# Patient Record
Sex: Female | Born: 1980 | Race: Black or African American | Hispanic: No | Marital: Single | Smoking: Never smoker
Health system: Southern US, Community
[De-identification: ages and names within clinical notes are randomized; demographics above are authoritative.]

## PROBLEM LIST (undated history)

## (undated) DIAGNOSIS — E119 Type 2 diabetes mellitus without complications: Secondary | ICD-10-CM

## (undated) DIAGNOSIS — G5600 Carpal tunnel syndrome, unspecified upper limb: Secondary | ICD-10-CM

## (undated) DIAGNOSIS — R200 Anesthesia of skin: Secondary | ICD-10-CM

## (undated) DIAGNOSIS — E785 Hyperlipidemia, unspecified: Secondary | ICD-10-CM

## (undated) DIAGNOSIS — F32A Depression, unspecified: Secondary | ICD-10-CM

## (undated) DIAGNOSIS — L732 Hidradenitis suppurativa: Secondary | ICD-10-CM

## (undated) DIAGNOSIS — IMO0001 Reserved for inherently not codable concepts without codable children: Secondary | ICD-10-CM

## (undated) DIAGNOSIS — O24419 Gestational diabetes mellitus in pregnancy, unspecified control: Secondary | ICD-10-CM

## (undated) DIAGNOSIS — H9190 Unspecified hearing loss, unspecified ear: Secondary | ICD-10-CM

## (undated) DIAGNOSIS — E039 Hypothyroidism, unspecified: Secondary | ICD-10-CM

## (undated) DIAGNOSIS — E669 Obesity, unspecified: Secondary | ICD-10-CM

## (undated) HISTORY — PX: TONSILLECTOMY: SUR1361

## (undated) HISTORY — DX: Anesthesia of skin: R20.0

## (undated) HISTORY — PX: THYROID SURGERY: SHX805

---
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
## 2002-11-26 ENCOUNTER — Emergency Department (HOSPITAL_COMMUNITY): Admission: EM | Admit: 2002-11-26 | Discharge: 2002-11-26 | Payer: Self-pay | Admitting: Emergency Medicine

## 2002-12-05 ENCOUNTER — Emergency Department (HOSPITAL_COMMUNITY): Admission: EM | Admit: 2002-12-05 | Discharge: 2002-12-05 | Payer: Self-pay | Admitting: Emergency Medicine

## 2003-04-07 ENCOUNTER — Inpatient Hospital Stay (HOSPITAL_COMMUNITY): Admission: AD | Admit: 2003-04-07 | Discharge: 2003-04-07 | Payer: Self-pay | Admitting: Obstetrics and Gynecology

## 2003-05-17 ENCOUNTER — Ambulatory Visit (HOSPITAL_COMMUNITY): Admission: RE | Admit: 2003-05-17 | Discharge: 2003-05-17 | Payer: Self-pay | Admitting: *Deleted

## 2003-06-07 ENCOUNTER — Ambulatory Visit (HOSPITAL_COMMUNITY): Admission: RE | Admit: 2003-06-07 | Discharge: 2003-06-07 | Payer: Self-pay | Admitting: *Deleted

## 2003-08-02 ENCOUNTER — Inpatient Hospital Stay (HOSPITAL_COMMUNITY): Admission: AD | Admit: 2003-08-02 | Discharge: 2003-08-02 | Payer: Self-pay | Admitting: Obstetrics

## 2003-08-13 ENCOUNTER — Inpatient Hospital Stay (HOSPITAL_COMMUNITY): Admission: AD | Admit: 2003-08-13 | Discharge: 2003-08-13 | Payer: Self-pay | Admitting: Obstetrics

## 2003-10-25 ENCOUNTER — Inpatient Hospital Stay (HOSPITAL_COMMUNITY): Admission: AD | Admit: 2003-10-25 | Discharge: 2003-10-30 | Payer: Self-pay | Admitting: Obstetrics

## 2003-10-25 ENCOUNTER — Ambulatory Visit (HOSPITAL_COMMUNITY): Admission: RE | Admit: 2003-10-25 | Discharge: 2003-10-25 | Payer: Self-pay | Admitting: Obstetrics

## 2004-02-15 ENCOUNTER — Emergency Department (HOSPITAL_COMMUNITY): Admission: EM | Admit: 2004-02-15 | Discharge: 2004-02-15 | Payer: Self-pay | Admitting: Emergency Medicine

## 2004-06-09 ENCOUNTER — Emergency Department (HOSPITAL_COMMUNITY): Admission: EM | Admit: 2004-06-09 | Discharge: 2004-06-09 | Payer: Self-pay | Admitting: Family Medicine

## 2004-10-13 ENCOUNTER — Inpatient Hospital Stay (HOSPITAL_COMMUNITY): Admission: AD | Admit: 2004-10-13 | Discharge: 2004-10-13 | Payer: Self-pay | Admitting: Obstetrics

## 2004-11-17 ENCOUNTER — Inpatient Hospital Stay (HOSPITAL_COMMUNITY): Admission: AD | Admit: 2004-11-17 | Discharge: 2004-11-18 | Payer: Self-pay | Admitting: Obstetrics

## 2004-12-28 ENCOUNTER — Inpatient Hospital Stay (HOSPITAL_COMMUNITY): Admission: AD | Admit: 2004-12-28 | Discharge: 2004-12-29 | Payer: Self-pay | Admitting: Obstetrics

## 2005-01-07 ENCOUNTER — Inpatient Hospital Stay (HOSPITAL_COMMUNITY): Admission: AD | Admit: 2005-01-07 | Discharge: 2005-01-07 | Payer: Self-pay | Admitting: Obstetrics

## 2005-01-31 ENCOUNTER — Inpatient Hospital Stay (HOSPITAL_COMMUNITY): Admission: AD | Admit: 2005-01-31 | Discharge: 2005-01-31 | Payer: Self-pay | Admitting: Obstetrics

## 2005-03-04 ENCOUNTER — Inpatient Hospital Stay (HOSPITAL_COMMUNITY): Admission: AD | Admit: 2005-03-04 | Discharge: 2005-03-04 | Payer: Self-pay | Admitting: Obstetrics

## 2005-03-11 ENCOUNTER — Inpatient Hospital Stay (HOSPITAL_COMMUNITY): Admission: AD | Admit: 2005-03-11 | Discharge: 2005-03-14 | Payer: Self-pay | Admitting: Obstetrics

## 2005-05-20 ENCOUNTER — Emergency Department (HOSPITAL_COMMUNITY): Admission: EM | Admit: 2005-05-20 | Discharge: 2005-05-20 | Payer: Self-pay | Admitting: Emergency Medicine

## 2005-09-11 ENCOUNTER — Emergency Department (HOSPITAL_COMMUNITY): Admission: EM | Admit: 2005-09-11 | Discharge: 2005-09-12 | Payer: Self-pay | Admitting: Emergency Medicine

## 2006-03-13 ENCOUNTER — Emergency Department (HOSPITAL_COMMUNITY): Admission: EM | Admit: 2006-03-13 | Discharge: 2006-03-13 | Payer: Self-pay | Admitting: Emergency Medicine

## 2007-01-09 ENCOUNTER — Ambulatory Visit (HOSPITAL_BASED_OUTPATIENT_CLINIC_OR_DEPARTMENT_OTHER): Admission: RE | Admit: 2007-01-09 | Discharge: 2007-01-09 | Payer: Self-pay | Admitting: Otolaryngology

## 2007-01-09 ENCOUNTER — Encounter (INDEPENDENT_AMBULATORY_CARE_PROVIDER_SITE_OTHER): Payer: Self-pay | Admitting: *Deleted

## 2007-04-05 ENCOUNTER — Emergency Department (HOSPITAL_COMMUNITY): Admission: EM | Admit: 2007-04-05 | Discharge: 2007-04-05 | Payer: Self-pay | Admitting: Family Medicine

## 2007-09-04 ENCOUNTER — Emergency Department (HOSPITAL_COMMUNITY): Admission: EM | Admit: 2007-09-04 | Discharge: 2007-09-04 | Payer: Self-pay | Admitting: Emergency Medicine

## 2008-05-03 ENCOUNTER — Emergency Department (HOSPITAL_COMMUNITY): Admission: EM | Admit: 2008-05-03 | Discharge: 2008-05-03 | Payer: Self-pay | Admitting: Emergency Medicine

## 2008-05-20 ENCOUNTER — Emergency Department (HOSPITAL_COMMUNITY): Admission: EM | Admit: 2008-05-20 | Discharge: 2008-05-20 | Payer: Self-pay | Admitting: Family Medicine

## 2008-06-05 ENCOUNTER — Emergency Department (HOSPITAL_COMMUNITY): Admission: EM | Admit: 2008-06-05 | Discharge: 2008-06-05 | Payer: Self-pay | Admitting: Emergency Medicine

## 2008-06-06 ENCOUNTER — Emergency Department (HOSPITAL_COMMUNITY): Admission: EM | Admit: 2008-06-06 | Discharge: 2008-06-06 | Payer: Self-pay | Admitting: Emergency Medicine

## 2008-07-29 ENCOUNTER — Emergency Department (HOSPITAL_COMMUNITY): Admission: EM | Admit: 2008-07-29 | Discharge: 2008-07-29 | Payer: Self-pay | Admitting: Family Medicine

## 2008-12-13 ENCOUNTER — Emergency Department (HOSPITAL_COMMUNITY): Admission: EM | Admit: 2008-12-13 | Discharge: 2008-12-13 | Payer: Self-pay | Admitting: Emergency Medicine

## 2009-02-23 ENCOUNTER — Other Ambulatory Visit: Payer: Self-pay | Admitting: Emergency Medicine

## 2009-02-24 ENCOUNTER — Ambulatory Visit: Payer: Self-pay | Admitting: Psychiatry

## 2009-02-24 ENCOUNTER — Inpatient Hospital Stay (HOSPITAL_COMMUNITY): Admission: RE | Admit: 2009-02-24 | Discharge: 2009-02-26 | Payer: Self-pay | Admitting: Psychiatry

## 2009-05-15 ENCOUNTER — Emergency Department (HOSPITAL_COMMUNITY): Admission: EM | Admit: 2009-05-15 | Discharge: 2009-05-16 | Payer: Self-pay | Admitting: Emergency Medicine

## 2009-06-04 ENCOUNTER — Emergency Department (HOSPITAL_COMMUNITY): Admission: EM | Admit: 2009-06-04 | Discharge: 2009-06-04 | Payer: Self-pay | Admitting: Family Medicine

## 2009-07-04 ENCOUNTER — Encounter: Admission: RE | Admit: 2009-07-04 | Discharge: 2009-09-08 | Payer: Self-pay | Admitting: Sports Medicine

## 2009-08-18 ENCOUNTER — Emergency Department (HOSPITAL_COMMUNITY): Admission: EM | Admit: 2009-08-18 | Discharge: 2009-08-18 | Payer: Self-pay | Admitting: Family Medicine

## 2010-05-29 ENCOUNTER — Emergency Department (HOSPITAL_COMMUNITY): Admission: EM | Admit: 2010-05-29 | Discharge: 2010-05-29 | Payer: Self-pay | Admitting: Family Medicine

## 2010-11-17 ENCOUNTER — Emergency Department (HOSPITAL_COMMUNITY)
Admission: EM | Admit: 2010-11-17 | Discharge: 2010-11-18 | Disposition: A | Discharge status: Home / Self Care | Payer: Medicaid Other | Attending: Emergency Medicine | Admitting: Emergency Medicine

## 2010-11-17 DIAGNOSIS — F329 Major depressive disorder, single episode, unspecified: Secondary | ICD-10-CM | POA: Insufficient documentation

## 2010-11-17 DIAGNOSIS — R51 Headache: Secondary | ICD-10-CM | POA: Insufficient documentation

## 2010-11-17 DIAGNOSIS — F3289 Other specified depressive episodes: Secondary | ICD-10-CM | POA: Insufficient documentation

## 2010-11-17 DIAGNOSIS — R05 Cough: Secondary | ICD-10-CM | POA: Insufficient documentation

## 2010-11-17 DIAGNOSIS — R002 Palpitations: Secondary | ICD-10-CM | POA: Insufficient documentation

## 2010-11-17 DIAGNOSIS — R059 Cough, unspecified: Secondary | ICD-10-CM | POA: Insufficient documentation

## 2010-11-18 ENCOUNTER — Emergency Department (HOSPITAL_COMMUNITY): Payer: Medicaid Other

## 2010-11-18 LAB — CBC
HCT: 36.3 % (ref 36.0–46.0)
MCH: 25.8 pg — ABNORMAL LOW (ref 26.0–34.0)
MCHC: 32.5 g/dL (ref 30.0–36.0)
MCV: 79.4 fL (ref 78.0–100.0)
RBC: 4.57 MIL/uL (ref 3.87–5.11)
WBC: 9.6 10*3/uL (ref 4.0–10.5)

## 2010-11-18 LAB — POCT I-STAT, CHEM 8
BUN: 4 mg/dL — ABNORMAL LOW (ref 6–23)
Chloride: 101 mEq/L (ref 96–112)
TCO2: 27 mmol/L (ref 0–100)

## 2010-11-18 LAB — DIFFERENTIAL
Basophils Relative: 0 % (ref 0–1)
Eosinophils Absolute: 0.1 10*3/uL (ref 0.0–0.7)
Eosinophils Relative: 1 % (ref 0–5)
Lymphocytes Relative: 28 % (ref 12–46)
Lymphs Abs: 2.7 10*3/uL (ref 0.7–4.0)
Monocytes Absolute: 0.8 10*3/uL (ref 0.1–1.0)

## 2010-12-10 LAB — POCT URINALYSIS DIPSTICK
Bilirubin Urine: NEGATIVE
Ketones, ur: NEGATIVE mg/dL
Protein, ur: NEGATIVE mg/dL
pH: 6.5 (ref 5.0–8.0)

## 2010-12-10 LAB — POCT PREGNANCY, URINE: Preg Test, Ur: NEGATIVE

## 2010-12-30 LAB — POCT PREGNANCY, URINE: Preg Test, Ur: NEGATIVE

## 2011-01-05 LAB — SALICYLATE LEVEL: Salicylate Lvl: <4 mg/dL (ref 2.8–20.0)

## 2011-01-05 LAB — RAPID URINE DRUG SCREEN, HOSP PERFORMED
Benzodiazepines: NOT DETECTED
Cocaine: NOT DETECTED

## 2011-01-05 LAB — BASIC METABOLIC PANEL
CO2: 27 mEq/L (ref 19–32)
Creatinine, Ser: 0.54 mg/dL (ref 0.4–1.2)
Glucose, Bld: 101 mg/dL — ABNORMAL HIGH (ref 70–99)

## 2011-01-05 LAB — ACETAMINOPHEN LEVEL: Acetaminophen (Tylenol), Serum: <10 ug/mL — ABNORMAL LOW (ref 10–30)

## 2011-01-07 LAB — POCT RAPID STREP A (OFFICE): Streptococcus, Group A Screen (Direct): POSITIVE — AB

## 2011-02-09 NOTE — Consult Note (Signed)
NAMEFAYOLA, MECKES NO.:  [id number]   MEDICAL RECORD NO.:  [id number]          PATIENT TYPE:  EMS   LOCATION:  MAJO                         FACILITY:  [facility]   PHYSICIAN:  Antonietta Breach, M.D.  DATE OF BIRTH:  [birth year]   DATE OF CONSULTATION:  02/24/2009  DATE OF DISCHARGE:                                 CONSULTATION   REASON FOR CONSULTATION:  Overdose.   HISTORY OF PRESENT ILLNESS:  Ms. Mcgath is a 30 year old female  presenting to the [facility] on the Feb 23, 2009, with an overdose.  She overdosed on Motrin and Flexeril with the intention to kill herself.   She has been experiencing greater than 3 weeks of depressed mood,  anhedonia, had poor energy, difficulty concentrating.  She does have a  past history of major depression and treatment.   PAST PSYCHIATRIC HISTORY:  Ms. Billick has a history of multiple  overdoses in the past.   She does not have a history of increased energy or decreased need for  sleep.  She has no history of hallucinations.   She was treated for major depression in 2009 with Zoloft as well as  trazodone.  The trazodone worked for insomnia; however, her depression  did not respond to the treatment overall at that time.   FAMILY PSYCHIATRIC HISTORY:  None known.   SOCIAL HISTORY:  She uses alcohol occasionally.  She does not use any  illegal drugs.  She is single.   PAST MEDICAL HISTORY:  She does have hearing loss in both ears.   PAST SURGICAL HISTORY:  1. Tonsillectomy.  2. C-section.  3. She has had 6 teeth removed recently, which has been part of her      stress.   She has no known drug allergies.   She is not on any current medications.   LABORATORY DATA:  Tylenol negative.  Alcohol negative.  Sodium 142, BUN  6, creatinine 0.54, glucose 101.   Aspirin negative.   REVIEW OF SYSTEMS:  GENITOURINARY:  Pregnancy test negative.  ENDOCRINE/METABOLIC:  Her urine drug screen was negative.  CONSTITUTIONAL, HEAD,  EYES, EARS, NOSE, THROAT, MOUTH, NEUROLOGIC,  PSYCHIATRIC, CARDIOVASCULAR, RESPIRATORY, GASTROINTESTINAL, SKIN,  MUSCULOSKELETAL, HEMATOLOGIC, LYMPHATIC unremarkable.   PHYSICAL EXAMINATION:  VITAL SIGNS:  Temperature 97.8, pulse 99,  respiratory rate 16, blood pressure 128/77.  GENERAL APPEARANCE:  Ms. Herrada is a young female appearing her  chronologic age with no abnormal involuntary movements.   MENTAL STATUS EXAM:  She does maintain good eye contact.  She is alert.  Her affect is anxious.  Her mood is anxious.  Concentration is  decreased.  Her orientation is intact to all spheres.  Her memory  function is intact to immediate, recent, and remote.   Fund of knowledge and intelligence grossly within normal limits.  Her  speech involves normal rate and prosody.  without dysarthria.  Thought  process is logical, coherent, goal-directed, and no looseness of  association in thought content.  She does acknowledge suicidal intent.   Her insight is partial.  Her judgment is impaired.   ASSESSMENT:   AXIS  I:  1. 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  2. Major depressive disorder, recurrent, severe.  3. 293.84:  Anxiety disorder not otherwise specified.   AXIS II:  Deferred.   AXIS III:  See past medical history.   AXIS IV:  General medical, primary support group.   AXIS V:  Thirty.   Ms. Rhody is at risk for suicide.   The undersigned provided education, indications, alternatives, and  adverse effects of trazodone were discussed with the patient for anti-  insomnia.  She understands and would like to proceed with trazodone.   DISCUSSION:  The trazodone can potentially augment a primary anti-  depression medicine or can be utilized as the primary anti-depression  agent if increased to appropriate dosage.   RECOMMENDATIONS:  1. Would start trazodone at 50 mg  q.h.s. with 50 mg available p.r.n.      insomnia.  Would use caution regarding nausea or excessive      sedation.  2. Ego supportive psychotherapy.  3. Would admit to an inpatient psychiatric unit.     Antonietta Breach, M.D.  Electronically Signed    JW/MEDQ  D:  02/24/2009  T:  02/24/2009  Job:  [id number]

## 2011-02-12 NOTE — H&P (Signed)
Sherry Morris, Sherry Morris                          ACCOUNT NO.:  [id number]   MEDICAL RECORD NO.:  [id number]                   PATIENT TYPE:  INP   LOCATION:  9132                                 [facility]   PHYSICIAN:  Kathreen Cosier, M.D.           DATE OF BIRTH:  Jan 01, 1981   DATE OF ADMISSION:  10/25/2003  DATE OF DISCHARGE:                                HISTORY & PHYSICAL   HISTORY OF PRESENT ILLNESS:  The patient is a 30 year old, gravida 1, EDC  November 08, 2003, gestational diabetic, followed by [facility].  On  glyburide 2.5 mg p.o. daily blood sugars are normal.  Amniocentesis was  performed at 38 weeks on October 25, 2003, and she had LS 2.6:1 with PG.  The patient was brought in for induction.  Her cervix was 1 cm, 60%, vertex  -2 to -3, and overnight, October 25, 2003, she received Cytotec x2.  At 7:40  a.m., the cervix was 1 cm, 60%.  Membranes were ruptured artificially, the  fluid was clear, and she was started on low-dose Pitocin.  She had also been  followed with a stress test last weekend.  On admission, her CBG was 93.   The patient received Pitocin stimulation throughout the day, and at 2:35  p.m., she was 3-4 cm, 70%, vertex -2 to -3.  IUPC was inserted.  She was on  18 units of Pitocin.  She had adequate labor throughout the day, and by 9:28  p.m., her temperature was 101.1, her cervix was still unchanged, and there  was a lot of molding.  It was decided she would be delivered by cesarean  section for failure to progress.   PHYSICAL EXAMINATION:  GENERAL:  A well-developed female in no distress.  HEENT:  Negative.  LUNGS:  Clear.  HEART:  Regular rhythm.  No murmurs, no gallops.  ABDOMEN:  Term-sized uterus.  Estimated fetal weight 7 pounds.  PELVIC:  As described above.  EXTREMITIES:  Negative.                                               Kathreen Cosier, M.D.    BAM/MEDQ  D:  10/26/2003  T:  10/26/2003  Job:  [id number]

## 2011-02-12 NOTE — Discharge Summary (Signed)
Sherry Morris, Sherry Morris              ACCOUNT NO.:  [id number]   MEDICAL RECORD NO.:  [id number]          PATIENT TYPE:  INP   LOCATION:  9132                          [facility]   PHYSICIAN:  Kathreen Cosier, M.D.DATE OF BIRTH:  1980/10/26   DATE OF ADMISSION:  03/11/2005  DATE OF DISCHARGE:  03/14/2005                                 DISCHARGE SUMMARY   The patient is a 30 year old gravida 2, para 1, 0, 0, 1. She had a cesarean  section in the past. Her EDC was March 18, 2005. Positive GBS, RH negative,  has had RhoGAM. She was admitted in labor, cervix 1 cm, 70%, vertex and -3.   She was started on low dose Pitocin and got to 3 cm then she started having  deep variables with each contraction. The cervix was 4 cm and she was  delivered by C-section because of non reassuring fetal heart rate tracing.  She had a female with Apgar's 9 and 9, weighing 7 pounds, 11 ounces. A cord  was wrapped around the waist.   Postoperatively she did well. On admission her hemoglobin was 10.0,  postoperatively 8.5, platelets 237,000 and 186,000. White count 9.4 and 10.4  thousand. RPR negative.   The patient was discharged home on the third postoperative day, ambulatory,  on a regular diet. Tylox for pain. To see me in 6 weeks.   DISCHARGE DIAGNOSIS:  Status post repeat low transverse cesarean section at  term for non reassuring fetal heart rate tracing.       BAM/MEDQ  D:  04/07/2005  T:  04/07/2005  Job:  [id number]

## 2011-02-12 NOTE — Op Note (Signed)
NAMESHANEA, KARNEY NO.:  [id number]   MEDICAL RECORD NO.:  [id number]          PATIENT TYPE:  AMB   LOCATION:  DSC                          FACILITY:  [facility]   PHYSICIAN:  Antony Contras, MD     DATE OF BIRTH:  10/05/1980   DATE OF PROCEDURE:  01/09/2007  DATE OF DISCHARGE:                               OPERATIVE REPORT   PREOPERATIVE DIAGNOSIS:  Tonsillar hypertrophy.   POSTOPERATIVE DIAGNOSIS:  Tonsillar hypertrophy.   PROCEDURE:  Tonsillectomy.   SURGEON:  Dr. Christia Reading.   ANESTHESIA:  General endotracheal anesthesia.   COMPLICATIONS:  None.   INDICATIONS:  The patient is a 30 year old African-American female who  has a long history of tonsil swelling that has caused her voice to  change.  She snores but apnea has not been witnessed.  She is found to  have extremely enlarged tonsils and presents to the operating room for  surgical management.   FINDINGS:  Tonsils were 4+ in size and cryptic on both sides.   DESCRIPTION OF PROCEDURE:  The patient was identified in the holding  room and, informed consent having been obtained, the patient was moved  to the operative suite and placed on the operative table in the supine  position.  Anesthesia was induced, and the patient was intubated by the  Anesthesia team without difficulty.  The patient was given intravenous  antibiotics and steroids during the case.  The eyes were taped closed  and the bed was turned degrees from Anesthesia.  A head wrap was placed  around the patient's head.  The oropharynx was then exposed using a  Crowe-Davis retractor which was placed in suspension on the Mayo stand.  The right tonsil was grasped with a curved Allis and the Coblater on a  setting of 7 was then used to make a curvilinear incision along the  anterior tonsillar pillar and extended into the subcapsular plane until  the tonsil was removed.  With bleeding encountered, two tonsil packs  were placed.  An attempt  was then made to expose the left side but  bleeding from the right side prohibited a good view so the right side  was again exposed.  The tonsil packs were removed and suction cautery on  a setting of 35 was then used to cauterize bleeding sites on the right  side.  Once this was stabilized, the left side was exposed and the  Coblater again used to make the curvilinear incision and extend into the  subcapsular plane.  With the bleeding from the left side at this point,  the Coblater was abandoned because it was not able to keep from being  clogged.  The Bovie electrocautery was then used to complete the  dissection, removing the tonsil.  Tonsil packs were placed in the left  side.  Tonsils were sent separately to pathology.  Bleeding was then  controlled in the left tonsillar fossae using suction cautery.  After  this was completed, the nose and throat were copiously irrigated with  saline.  A flexible suction catheter was passed down the esophagus and  used to suck out the stomach and esophagus.  The retractor was then taken out of suspension and removed from the  patient's mouth after first opening it one extra time to check for  bleeding.  The patient was then turned back to Anesthesia for wake-up  and was extubated and moved to the recovery room in stable condition.      Antony Contras, MD  Electronically Signed     DDB/MEDQ  D:  01/09/2007  T:  01/09/2007  Job:  [id number]

## 2011-02-12 NOTE — Op Note (Signed)
NAMERMANI, KAPUSTA                          ACCOUNT NO.:  [id number]   MEDICAL RECORD NO.:  [id number]                   PATIENT TYPE:  INP   LOCATION:  9132                                 [facility]   PHYSICIAN:  Kathreen Cosier, M.D.           DATE OF BIRTH:  1981-02-06   DATE OF PROCEDURE:  10/26/2003  DATE OF DISCHARGE:                                 OPERATIVE REPORT   PREOPERATIVE DIAGNOSES:  1. Progressive labor.  2. Failed induction at 38 weeks in a diabetic.   ANESTHESIA:  Epidural.   SURGEON:  Kathreen Cosier, M.D.   DESCRIPTION OF PROCEDURE:  The patient was placed on the operating room  table in the supine position.  The abdomen was prepped and draped; bladder  emptied with a Foley catheter.  A transverse suprapubic incision is made and  carried down through rectus fascia.  The fascia was cleaned and incised the  length of the incision.  The rectus muscles were retracted laterally.  The  peritoneum incised longitudinally.  A transverse incision made in the  viscera above the bladder and the bladder immobilized inferiorly.  A  transverse lower uterine incision made; the fluid was clear.   The patient was delivered in the OP position of a female, Apgars 8 and 9 --  weighing 6 pounds 13 ounces.  The placenta was anterior, moved manually.  The uterine cavity cleaned with dry  laps.   The uterine incision closed in one layer with continuous suture of #1  chromic.  Hemostasis was satisfactory.  The bladder flap reattached with 2-0  chromic.  The uterus was well contracted.  Tubes and ovaries were normal.  The abdomen closed in layers.  The peritoneum closed with continuous suture  of 0 chromic.  Fascia closed with continuous suture of 0 Dexon and skin  closed with subcuticular stitch of 3-0 Monocryl.                                               Kathreen Cosier, M.D.    BAM/MEDQ  D:  10/26/2003  T:  10/26/2003  Job:  [id number]

## 2011-02-12 NOTE — Discharge Summary (Signed)
NAMEKATHARINE, Sherry Morris NO.:  [id number]   MEDICAL RECORD NO.:  [id number]                   PATIENT TYPE:  INP   LOCATION:  9132                                 [facility]   PHYSICIAN:  Kathreen Cosier, M.D.           DATE OF BIRTH:  12/03/1980   DATE OF ADMISSION:  10/25/2003  DATE OF DISCHARGE:                                 DISCHARGE SUMMARY   HOSPITAL COURSE:  The patient is a 30 year old gravida 1 with [facility] November 08, 2003.  She is a gestational diabetic followed by [facility].  She  had nonstress tests twice weekly.  Sugars were all normal.  She was on  glyburide 2.5 daily.  An amniocentesis done on January 28 showed an L/S of  2.6:1 with PG and she was brought in for induction.  She got Cytotec  overnight x2.  On January 29 she was 1 cm, 60%, vertex at -2 to -3.  Membranes were ruptured artificially, the fluid was clear, and she was  started on low-dose Pitocin.  The patient reached 3-4 cm and there was a lot  of molding from -2 to -3.  She had a temperature of 101 but she had made no  progress, and was delivered by low transverse cesarean section.  She had a  female, Apgars of 8 and 9, from the OP position, anterior placenta.  She did  well postoperatively and had one temperature of 101 on day #2.  She was  given IV ampicillin and rapidly defervesced.  She was discharged on day #4  with Tylox for pain and ampicillin 500 p.o. q.6h. to see me in 6 weeks.   DISCHARGE DIAGNOSIS:  Status post primary low transverse cesarean section at  term for failure to progress in labor.                                               Kathreen Cosier, M.D.    BAM/MEDQ  D:  10/30/2003  T:  10/30/2003  Job:  [id number]

## 2011-02-12 NOTE — Op Note (Signed)
NAMESHALINI, MAIR              ACCOUNT NO.:  [id number]   MEDICAL RECORD NO.:  [id number]          PATIENT TYPE:  INP   LOCATION:  9132                          [facility]   PHYSICIAN:  Kathreen Cosier, M.D.DATE OF BIRTH:  1981-06-03   DATE OF PROCEDURE:  03/11/2005  DATE OF DISCHARGE:                                 OPERATIVE REPORT   PREOPERATIVE DIAGNOSIS:  Nonreassuring fetal heart rate tracing.   SURGEON:  Kathreen Cosier, M.D.   ANESTHESIA:  Epidural.   DESCRIPTION OF PROCEDURE:  The patient placed on the operating room in the  supine position after epidural.  Abdomen prepped and draped.  Bladder  emptied and Foley catheter.  Transverse suprapubic incision made, carried  down through the rectus fascia.  The fascia was cleaned __________ of the  incision.  Rectus muscle were retracted laterally.  The peritoneum was  incised longitudinally.  Transverse incision made in the vesicouterine  peritoneum above the bladder.  The bladder mobilized inferiorly.  Transverse  lower uterine incision made.  The fluid was clear.  The patient was  delivered from the OP position of a female, Apgars 9 and 9, weighing 7 pounds  11 ounces.  The cord was wrapped around its waist.  The team was in  attendance.  Placenta was posterior and removed manually.  Uterine cavity  was cleaned with dry laps.  Uterine incision closed in one layer with  continuous suture of #1 chromic.  Hemostasis was satisfactory.  Bladder flap  reattached with 2-0 chromic.  Uterus was retracted.  Tubes and ovaries  normal.  Abdomen closed in layers.  Peritoneum with continuous suture of 0  chromic, fascia with continuous suture of 0 Dexon and skin closed with  subcuticular suture of 4-0 Monocryl.  Blood loss 500 mL.       BAM/MEDQ  D:  03/11/2005  T:  03/12/2005  Job:  [id number]
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
## 2011-02-12 NOTE — H&P (Signed)
Sherry Morris, ASCH NO.:  [id number]   MEDICAL RECORD NO.:  [id number]          PATIENT TYPE:  INP   LOCATION:  9132                          [facility]ILITY[facility]  [facility]   PHYSICIAN:  Kathreen Cosier, M.D.DATE OF BIRTH:  1981/01/28   DATE OF ADMISSION:  03/11/2005  DATE OF DISCHARGE:                                HISTORY & PHYSICAL   HISTORY OF PRESENT ILLNESS:  The patient is a 30 year old gravida 2 para 1-0-  0-1 who had a previous C-section; [facility] March 18, 2005. She had a positive GBS,  was Rh negative, and got RhoGAM. Her membranes ruptured at 4 a.m. today,  fluid was clear. The patient was 1 cm, 70%, vertex, -2 to -3 and on  admission she was started on low-dose Pitocin after an  IUPC was inserted.  She progressed to 4 cm and at that point started having deep variables with  each contraction. Amnioinfusion was begun; however, the variables did not  improve. She was placed on O2 and position change with no improvement. It  was decided she would be delivered by repeat C-section for nonreassuring  fetal heart rate tracing.   PHYSICAL EXAMINATION:  GENERAL:  Revealed a well-developed female in labor.  HEENT:  Negative.  LUNGS:  Clear.  HEART:  Regular rhythm, no murmurs, no gallops.  BREAST:  No masses.  ABDOMEN:  Term-size uterus.  EXTREMITIES:  Negative.       BAM/MEDQ  D:  03/11/2005  T:  03/11/2005  Job:  [id number]

## 2011-04-08 ENCOUNTER — Inpatient Hospital Stay (INDEPENDENT_AMBULATORY_CARE_PROVIDER_SITE_OTHER)
Admission: RE | Admit: 2011-04-08 | Discharge: 2011-04-08 | Disposition: A | Discharge status: Home / Self Care | Payer: Medicaid Other | Source: Ambulatory Visit | Attending: Family Medicine | Admitting: Family Medicine

## 2011-04-08 DIAGNOSIS — S8010XA Contusion of unspecified lower leg, initial encounter: Secondary | ICD-10-CM

## 2011-05-16 ENCOUNTER — Emergency Department (HOSPITAL_COMMUNITY)
Admission: EM | Admit: 2011-05-16 | Discharge: 2011-05-16 | Disposition: A | Discharge status: Home / Self Care | Payer: Medicaid Other | Attending: Emergency Medicine | Admitting: Emergency Medicine

## 2011-05-16 DIAGNOSIS — R630 Anorexia: Secondary | ICD-10-CM | POA: Insufficient documentation

## 2011-05-16 DIAGNOSIS — F329 Major depressive disorder, single episode, unspecified: Secondary | ICD-10-CM | POA: Insufficient documentation

## 2011-05-16 DIAGNOSIS — R509 Fever, unspecified: Secondary | ICD-10-CM | POA: Insufficient documentation

## 2011-05-16 DIAGNOSIS — F3289 Other specified depressive episodes: Secondary | ICD-10-CM | POA: Insufficient documentation

## 2011-05-16 DIAGNOSIS — J029 Acute pharyngitis, unspecified: Secondary | ICD-10-CM | POA: Insufficient documentation

## 2011-05-16 LAB — PREGNANCY, URINE: Preg Test, Ur: NEGATIVE

## 2011-06-30 LAB — URINALYSIS, ROUTINE W REFLEX MICROSCOPIC
Glucose, UA: NEGATIVE
Ketones, ur: 15 — AB
Protein, ur: 100 — AB
Specific Gravity, Urine: 1.015
Urobilinogen, UA: 1
pH: 5

## 2011-06-30 LAB — COMPREHENSIVE METABOLIC PANEL
Albumin: 3.7
Alkaline Phosphatase: 94
BUN: 8
Chloride: 101
Glucose, Bld: 93
Potassium: 3.9
Total Bilirubin: 0.9

## 2011-06-30 LAB — DIFFERENTIAL
Basophils Absolute: 0
Basophils Relative: 0
Monocytes Absolute: 0.4
Neutro Abs: 11.5 — ABNORMAL HIGH

## 2011-06-30 LAB — URINE CULTURE

## 2011-06-30 LAB — CBC
HCT: 37.7
Hemoglobin: 12.3
RBC: 4.79
WBC: 13.7 — ABNORMAL HIGH

## 2011-06-30 LAB — POCT URINALYSIS DIP (DEVICE)
Operator id: 30745
Protein, ur: >=300 — AB
Specific Gravity, Urine: 1.015
Urobilinogen, UA: 0.2

## 2011-06-30 LAB — URINE MICROSCOPIC-ADD ON

## 2011-07-05 LAB — POCT CARDIAC MARKERS
CKMB, poc: 1.3
Myoglobin, poc: 44.4
Operator id: 294501
Troponin i, poc: <0.05

## 2011-07-05 LAB — I-STAT 8, (EC8 V) (CONVERTED LAB)
BUN: 6
Bicarbonate: 23.9
Glucose, Bld: 76
HCT: 41
Operator id: 294501
pCO2, Ven: 41.8 — ABNORMAL LOW
pH, Ven: 7.365 — ABNORMAL HIGH

## 2013-11-11 ENCOUNTER — Emergency Department (HOSPITAL_COMMUNITY): Payer: Medicaid Other

## 2013-11-11 ENCOUNTER — Encounter (HOSPITAL_COMMUNITY): Payer: Self-pay | Admitting: Emergency Medicine

## 2013-11-11 ENCOUNTER — Emergency Department (HOSPITAL_COMMUNITY)
Admission: EM | Admit: 2013-11-11 | Discharge: 2013-11-11 | Disposition: A | Discharge status: Home / Self Care | Payer: Medicaid Other | Attending: Emergency Medicine | Admitting: Emergency Medicine

## 2013-11-11 DIAGNOSIS — R0789 Other chest pain: Secondary | ICD-10-CM | POA: Insufficient documentation

## 2013-11-11 DIAGNOSIS — Z3202 Encounter for pregnancy test, result negative: Secondary | ICD-10-CM | POA: Insufficient documentation

## 2013-11-11 LAB — CBC
HCT: 36.6 % (ref 36.0–46.0)
Hemoglobin: 12 g/dL (ref 12.0–15.0)
MCH: 26.4 pg (ref 26.0–34.0)
MCHC: 32.8 g/dL (ref 30.0–36.0)
MCV: 80.4 fL (ref 78.0–100.0)
Platelets: 278 10*3/uL (ref 150–400)
RBC: 4.55 MIL/uL (ref 3.87–5.11)
RDW: 13.5 % (ref 11.5–15.5)
WBC: 8.4 10*3/uL (ref 4.0–10.5)

## 2013-11-11 LAB — BASIC METABOLIC PANEL
Chloride: 102 mEq/L (ref 96–112)
GFR calc Af Amer: >90 mL/min (ref >90)
GFR calc non Af Amer: >90 mL/min (ref >90)
Potassium: 3.5 mEq/L — ABNORMAL LOW (ref 3.7–5.3)
Sodium: 139 mEq/L (ref 137–147)

## 2013-11-11 LAB — POCT I-STAT TROPONIN I: Troponin i, poc: 0 ng/mL (ref 0.00–0.08)

## 2013-11-11 LAB — POCT PREGNANCY, URINE: Preg Test, Ur: NEGATIVE

## 2013-11-11 LAB — BASIC METABOLIC PANEL WITH GFR
BUN: 5 mg/dL — ABNORMAL LOW (ref 6–23)
CO2: 25 meq/L (ref 19–32)
Calcium: 9.2 mg/dL (ref 8.4–10.5)
Creatinine, Ser: 0.52 mg/dL (ref 0.50–1.10)
Glucose, Bld: 99 mg/dL (ref 70–99)

## 2013-11-11 LAB — TROPONIN I: Troponin I: <0.3 ng/mL (ref <0.30)

## 2013-11-11 LAB — D-DIMER, QUANTITATIVE (NOT AT ARMC)

## 2013-11-11 NOTE — ED Notes (Signed)
Pt hungry waiting on xray to be done now

## 2013-11-11 NOTE — Discharge Instructions (Signed)
Pain of Unknown Etiology (Pain Without a Known Cause) You have come to your caregiver because of pain. Pain can occur in any part of the body. Often there is not a definite cause. If your laboratory (blood or urine) work was normal and X-rays or other studies were normal, your caregiver may treat you without knowing the cause of the pain. An example of this is the headache. Most headaches are diagnosed by taking a history. This means your caregiver asks you questions about your headaches. Your caregiver determines a treatment based on your answers. Usually testing done for headaches is normal. Often testing is not done unless there is no response to medications. Regardless of where your pain is located today, you can be given medications to make you comfortable. If no physical cause of pain can be found, most cases of pain will gradually leave as suddenly as they came.  If you have a painful condition and no reason can be found for the pain, it is important that you follow up with your caregiver. If the pain becomes worse or does not go away, it may be necessary to repeat tests and look further for a possible cause.  Only take over-the-counter or prescription medicines for pain, discomfort, or fever as directed by your caregiver.  For the protection of your privacy, test results cannot be given over the phone. Make sure you receive the results of your test. Ask how these results are to be obtained if you have not been informed. It is your responsibility to obtain your test results.  You may continue all activities unless the activities cause more pain. When the pain lessens, it is important to gradually resume normal activities. Resume activities by beginning slowly and gradually increasing the intensity and duration of the activities or exercise. During periods of severe pain, bed rest may be helpful. Lie or sit in any position that is comfortable.  Ice used for acute (sudden) conditions may be effective.  Use a large plastic bag filled with ice and wrapped in a towel. This may provide pain relief.  See your caregiver for continued problems. Your caregiver can help or refer you for exercises or physical therapy if necessary. If you were given medications for your condition, do not drive, operate machinery or power tools, or sign legal documents for 24 hours. Do not drink alcohol, take sleeping pills, or take other medications that may interfere with treatment. See your caregiver immediately if you have pain that is becoming worse and not relieved by medications. Document Released: 06/08/2001 Document Revised: 07/04/2013 Document Reviewed: 09/13/2005 [facility] Patient Information 2014 [place].

## 2013-11-11 NOTE — ED Notes (Signed)
Neg preg test   Called to xray they will come get her for her xray now

## 2013-11-11 NOTE — ED Provider Notes (Signed)
Patient originally seen by Alyse Low, PA_C  She is waiting for chest xray for cardiac work-up.  Sherry Morris is a 33 y.o.female without any significant PMH presents to the ER with complaints of chest pains. Her EKG, and Troponin are both WNL.  Her chest xray is normal. Patient is under a lot of stress and does not having any risk factors for cardiac events, aside from her mother having CHF. Patient given referral for follow-up. Pt currently with no pain and discomfort, reports being ready to go home.  Results for orders placed during the hospital encounter of 11/11/13  CBC      Result Value Ref Range   WBC 8.4  4.0 - 10.5 K/uL   RBC 4.55  3.87 - 5.11 MIL/uL   Hemoglobin 12.0  12.0 - 15.0 g/dL   HCT 36.6  36.0 - 46.0 %   MCV 80.4  78.0 - 100.0 fL   MCH 26.4  26.0 - 34.0 pg   MCHC 32.8  30.0 - 36.0 g/dL   RDW 13.5  11.5 - 15.5 %   Platelets 278  150 - 400 K/uL  BASIC METABOLIC PANEL      Result Value Ref Range   Sodium 139  137 - 147 mEq/L   Potassium 3.5 (*) 3.7 - 5.3 mEq/L   Chloride 102  96 - 112 mEq/L   CO2 25  19 - 32 mEq/L   Glucose, Bld 99  70 - 99 mg/dL   BUN 5 (*) 6 - 23 mg/dL   Creatinine, Ser 0.52  0.50 - 1.10 mg/dL   Calcium 9.2  8.4 - 10.5 mg/dL   GFR calc non Af Amer >90  >90 mL/min   GFR calc Af Amer >90  >90 mL/min  TROPONIN I      Result Value Ref Range   Troponin I <0.30  <0.30 ng/mL  D-DIMER, QUANTITATIVE      Result Value Ref Range   D-Dimer, Quant <0.27  0.00 - 0.48 ug/mL-FEU  POCT I-STAT TROPONIN I      Result Value Ref Range   Troponin i, poc 0.00  0.00 - 0.08 ng/mL   Comment 3           POCT PREGNANCY, URINE      Result Value Ref Range   Preg Test, Ur NEGATIVE  NEGATIVE   Dg Chest 2 View  11/11/2013   CLINICAL DATA:  Chest pain.  EXAM: CHEST  2 VIEW  COMPARISON:  PA and lateral chest 11/18/2010.  FINDINGS: Heart size and mediastinal contours are within normal limits. Both lungs are clear. Visualized skeletal structures are unremarkable.   IMPRESSION: Negative exam.   Electronically Signed   By: Inge Rise M.D.   On: 11/11/2013 18:41     33 y.o.[name]'s evaluation in the Emergency Department is complete. It has been determined that no acute conditions requiring further emergency intervention are present at this time. The patient/guardian have been advised of the diagnosis and plan. We have discussed signs and symptoms that warrant return to the ED, such as changes or worsening in symptoms.  Vital signs are stable at discharge. Filed Vitals:   11/11/13 1915  BP:   Pulse: 86  Temp:   Resp:     Patient/guardian has voiced understanding and agreed to follow-up with the PCP or specialist.     Linus Mako, PA-C 11/11/13 1924

## 2013-11-11 NOTE — ED Provider Notes (Signed)
Medical screening examination/treatment/procedure(s) were performed by non-physician practitioner and as supervising physician I was immediately available for consultation/collaboration.  EKG Interpretation   None         Charles B. Karle Starch, MD 11/11/13 1925

## 2013-11-11 NOTE — ED Notes (Signed)
Pt c/o cp since last night and feeling lightheaded today. States pain on R side of chest and "Feels like it comes and goes when i take a deep breath." denies sob. A&ox4, resp e/u

## 2013-11-11 NOTE — ED Provider Notes (Signed)
CSN: [id number]     Arrival date & time 11/11/13  1318 History   First MD Initiated Contact with Patient 11/11/13 1359     Chief Complaint  Patient presents with  . Chest Pain     (Consider location/radiation/quality/duration/timing/severity/associated sxs/prior Treatment) Patient is a 33 y.o. female presenting with chest pain. The history is provided by the patient. No language interpreter was used.  Chest Pain Pain location:  R chest Pain quality: aching   Pain radiates to the back: no   Pain severity:  Moderate Onset quality:  Sudden Duration:  1 minute Timing:  Constant Progression:  Worsening Chronicity:  Recurrent Context: breathing   Relieved by:  Nothing Worsened by:  Nothing tried Ineffective treatments:  None tried Associated symptoms: no abdominal pain   Risk factors: no aortic disease     History reviewed. No pertinent past medical history. History reviewed. No pertinent past surgical history. History reviewed. No pertinent family history. History  Substance Use Topics  . Smoking status: Never Smoker   . Smokeless tobacco: Not on file  . Alcohol Use: Yes   OB History   Grav Para Term Preterm Abortions TAB SAB Ect Mult Living                 Review of Systems  Cardiovascular: Positive for chest pain.  Gastrointestinal: Negative for abdominal pain.  All other systems reviewed and are negative.      Allergies  Review of patient's allergies indicates no known allergies.  Home Medications  No current outpatient prescriptions on file. BP 123/62  Pulse 97  Temp(Src) 98.2 F (36.8 C) (Oral)  Resp 16  Ht 5\' 3"  (1.6 m)  Wt 282 lb 11.2 oz (128.232 kg)  BMI 50.09 kg/m2  SpO2 100% Physical Exam  Nursing note and vitals reviewed. Constitutional: She is oriented to person, place, and time. She appears well-developed and well-nourished.  HENT:  Head: Normocephalic and atraumatic.  Right Ear: External ear normal.  Left Ear: External ear normal.   Nose: Nose normal.  Mouth/Throat: Oropharynx is clear and moist.  Eyes: Conjunctivae and EOM are normal. Pupils are equal, round, and reactive to light.  Neck: Normal range of motion. Neck supple.  Cardiovascular: Normal rate, regular rhythm and normal heart sounds.   Pulmonary/Chest: Effort normal and breath sounds normal.  Abdominal: Soft. Bowel sounds are normal.  Musculoskeletal: Normal range of motion.  Neurological: She is alert and oriented to person, place, and time. She has normal reflexes.  Skin: Skin is warm.  Psychiatric: She has a normal mood and affect.    ED Course  Procedures (including critical care time) Labs Review Labs Reviewed  CBC  BASIC METABOLIC PANEL   Imaging Review No results found.  EKG Interpretation   None      Results for orders placed during the hospital encounter of 11/11/13  CBC      Result Value Ref Range   WBC 8.4  4.0 - 10.5 K/uL   RBC 4.55  3.87 - 5.11 MIL/uL   Hemoglobin 12.0  12.0 - 15.0 g/dL   HCT 36.6  36.0 - 46.0 %   MCV 80.4  78.0 - 100.0 fL   MCH 26.4  26.0 - 34.0 pg   MCHC 32.8  30.0 - 36.0 g/dL   RDW 13.5  11.5 - 15.5 %   Platelets 278  150 - 400 K/uL  D-DIMER, QUANTITATIVE      Result Value Ref Range   D-Dimer, Quant <0.27  0.00 - 0.48 ug/mL-FEU  POCT I-STAT TROPONIN I      Result Value Ref Range   Troponin i, poc 0.00  0.00 - 0.08 ng/mL   Comment 3            No results found.   MDM I think pain is muscular.  Pt is given rx for ibuprofen.   Pt advised to return if any problems.   Final diagnoses:  Chest discomfort        [place], [place] 11/12/13 [phone]

## 2013-11-11 NOTE — ED Notes (Signed)
Pt is in a gown and on the monitor. Pt has urine at bedside

## 2013-11-11 NOTE — ED Notes (Signed)
The pts xray delayed because  Xray reports waiting on a urine preg test.  Urine sent now

## 2013-11-11 NOTE — ED Notes (Signed)
Pt has no pain she wants something to eat.  No for now

## 2013-11-12 NOTE — ED Provider Notes (Signed)
Medical screening examination/treatment/procedure(s) were performed by non-physician practitioner and as supervising physician I was immediately available for consultation/collaboration.      Saddie Benders. Dorna Mai, MD 11/12/13 [phone]

## 2013-11-28 ENCOUNTER — Emergency Department (INDEPENDENT_AMBULATORY_CARE_PROVIDER_SITE_OTHER)
Admission: EM | Admit: 2013-11-28 | Discharge: 2013-11-28 | Disposition: A | Discharge status: Home / Self Care | Payer: Medicaid Other | Source: Home / Self Care | Attending: Family Medicine | Admitting: Family Medicine

## 2013-11-28 ENCOUNTER — Encounter (HOSPITAL_COMMUNITY): Payer: Self-pay | Admitting: Emergency Medicine

## 2013-11-28 DIAGNOSIS — J029 Acute pharyngitis, unspecified: Secondary | ICD-10-CM

## 2013-11-28 LAB — POCT PREGNANCY, URINE: Preg Test, Ur: NEGATIVE

## 2013-11-28 LAB — POCT RAPID STREP A: Streptococcus, Group A Screen (Direct): NEGATIVE

## 2013-11-28 MED ORDER — IPRATROPIUM BROMIDE 0.06 % NA SOLN: 2.0000 | Freq: Four times a day (QID) | NASAL | Status: DC

## 2013-11-28 MED ORDER — TRAMADOL HCL 50 MG PO TABS: 50.0000 mg | ORAL_TABLET | Freq: Every evening | ORAL | Status: DC | PRN

## 2013-11-28 MED ORDER — PREDNISONE 10 MG PO TABS: 30.0000 mg | ORAL_TABLET | Freq: Every day | ORAL | Status: DC

## 2013-11-28 NOTE — Discharge Instructions (Signed)
Thank you for coming in today. Call or go to the emergency room if you get worse, have trouble breathing, have chest pains, or palpitations.  Pharyngitis Pharyngitis is redness, pain, and swelling (inflammation) of your pharynx.  CAUSES  Pharyngitis is usually caused by infection. Most of the time, these infections are from viruses (viral) and are part of a cold. However, sometimes pharyngitis is caused by bacteria (bacterial). Pharyngitis can also be caused by allergies. Viral pharyngitis may be spread from person to person by coughing, sneezing, and personal items or utensils (cups, forks, spoons, toothbrushes). Bacterial pharyngitis may be spread from person to person by more intimate contact, such as kissing.  SIGNS AND SYMPTOMS  Symptoms of pharyngitis include:   Sore throat.   Tiredness (fatigue).   Low-grade fever.   Headache.  Joint pain and muscle aches.  Skin rashes.  Swollen lymph nodes.  Plaque-like film on throat or tonsils (often seen with bacterial pharyngitis). DIAGNOSIS  Your health care provider will ask you questions about your illness and your symptoms. Your medical history, along with a physical exam, is often all that is needed to diagnose pharyngitis. Sometimes, a rapid strep test is done. Other lab tests may also be done, depending on the suspected cause.  TREATMENT  Viral pharyngitis will usually get better in 3 4 days without the use of medicine. Bacterial pharyngitis is treated with medicines that kill germs (antibiotics).  HOME CARE INSTRUCTIONS   Drink enough water and fluids to keep your urine clear or pale yellow.   Only take over-the-counter or prescription medicines as directed by your health care provider:   If you are prescribed antibiotics, make sure you finish them even if you start to feel better.   Do not take aspirin.   Get lots of rest.   Gargle with 8 oz of salt water ( tsp of salt per 1 qt of water) as often as every 1 2  hours to soothe your throat.   Throat lozenges (if you are not at risk for choking) or sprays may be used to soothe your throat. SEEK MEDICAL CARE IF:   You have large, tender lumps in your neck.  You have a rash.  You cough up green, yellow-brown, or bloody spit. SEEK IMMEDIATE MEDICAL CARE IF:   Your neck becomes stiff.  You drool or are unable to swallow liquids.  You vomit or are unable to keep medicines or liquids down.  You have severe pain that does not go away with the use of recommended medicines.  You have trouble breathing (not caused by a stuffy nose). MAKE SURE YOU:   Understand these instructions.  Will watch your condition.  Will get help right away if you are not doing well or get worse. Document Released: 09/13/2005 Document Revised: 07/04/2013 Document Reviewed: 05/21/2013 [facility] Patient Information 2014 [place].

## 2013-11-28 NOTE — ED Provider Notes (Signed)
Sherry Morris is a 33 y.o. female who presents to Urgent Care today for sore throat for one week associated with cough and congestion. Patient also noted nasal discharge. No fevers or chills nausea vomiting or diarrhea. No trouble breathing. Throat is moderate and worse with swallowing. Patient has a mildly productive cough is bothersome.   History reviewed. No pertinent past medical history. History  Substance Use Topics  . Smoking status: Never Smoker   . Smokeless tobacco: Not on file  . Alcohol Use: Yes   ROS as above Medications: No current facility-administered medications for this encounter.   Current Outpatient Prescriptions  Medication Sig Dispense Refill  . ipratropium (ATROVENT) 0.06 % nasal spray Place 2 sprays into both nostrils 4 (four) times daily.  15 mL  1  . predniSONE (DELTASONE) 10 MG tablet Take 3 tablets (30 mg total) by mouth daily.  15 tablet  0  . traMADol (ULTRAM) 50 MG tablet Take 1 tablet (50 mg total) by mouth at bedtime as needed (cough).  10 tablet  0    Exam:  BP 117/71  Pulse 99  Temp(Src) 99 F (37.2 C) (Oral)  Resp 22  SpO2 100%  LMP 11/04/2013 Gen: Well NAD HEENT: EOMI,  MMM clear nasal discharge. Posterior pharynx cobblestoning. Normal tympanic membranes bilaterally Lungs: Normal work of breathing. CTABL Heart: RRR no MRG Abd: NABS, Soft. NT, ND Exts: Brisk capillary refill, warm and well perfused.   Results for orders placed during the hospital encounter of 11/28/13 (from the past 24 hour(s))  POCT PREGNANCY, URINE     Status: None   Collection Time    11/28/13  6:54 PM      Result Value Ref Range   Preg Test, Ur NEGATIVE  NEGATIVE  POCT RAPID STREP A ([facility]RG CARE ONLY)     Status: None   Collection Time    11/28/13  7:02 PM      Result Value Ref Range   Streptococcus, Group A Screen (Direct) NEGATIVE  NEGATIVE   No results found.  Assessment and Plan: 33 y.o. female with pharyngitis. Likely viral. Plan to treat with  prednisone, Atrovent nasal spray and tramadol for cough.  Discussed warning signs or symptoms. Please see discharge instructions. Patient expresses understanding.    Gregor Hams, MD 11/28/13 [phone]

## 2013-11-28 NOTE — ED Notes (Signed)
Patient reports sore throat, dry cough, phlegm in throat.   Patient also reports she is not feeling like normal, non-specific, feels like she did when pregnant.  Home preg test negative

## 2013-11-30 LAB — CULTURE, GROUP A STREP

## 2013-12-02 ENCOUNTER — Telehealth (HOSPITAL_COMMUNITY): Payer: Self-pay | Admitting: Emergency Medicine

## 2013-12-02 MED ORDER — AMOXICILLIN 875 MG PO TABS: 875.0000 mg | ORAL_TABLET | Freq: Two times a day (BID) | ORAL | Status: DC

## 2013-12-02 NOTE — ED Notes (Signed)
Throat culture: Strep beta hemolytic not group A.  3/6 Message sent to Dr. Georgina Snell. 3/8 Rx. done by Willeen Cass NP.  I called pt. Pt. verified x 2 and given result.  Pt. told she needs Amoxicillin 875 mg. BID #20.  Pt. wants Rx. called to [facility] on [address]. Pt. told that anyone she exposed gets the same symptoms should get checked for strep as well.  Rx. called to pharmacist @ [phone]. Roselyn Meier 12/02/2013

## 2013-12-02 NOTE — Telephone Encounter (Signed)
S. York, RN, brought strep pos results to me. Ordered amox. No pharmacy on file. [name] will call pt and call in rx.

## 2014-07-01 LAB — PROCEDURE REPORT - SCANNED: PAP SMEAR: NEGATIVE

## 2014-07-04 ENCOUNTER — Encounter: Payer: Self-pay | Admitting: Neurology

## 2014-07-04 ENCOUNTER — Encounter (INDEPENDENT_AMBULATORY_CARE_PROVIDER_SITE_OTHER): Payer: Self-pay

## 2014-07-04 ENCOUNTER — Ambulatory Visit (INDEPENDENT_AMBULATORY_CARE_PROVIDER_SITE_OTHER): Payer: Medicaid Other | Admitting: Neurology

## 2014-07-04 VITALS — BP 130/79 | HR 88 | Ht 64.0 in | Wt 300.0 lb

## 2014-07-04 DIAGNOSIS — R200 Anesthesia of skin: Secondary | ICD-10-CM | POA: Insufficient documentation

## 2014-07-04 DIAGNOSIS — M545 Low back pain, unspecified: Secondary | ICD-10-CM

## 2014-07-04 NOTE — Progress Notes (Signed)
PATIENT: Melany Guernsey DOB: 05-31-1981  HISTORICAL  KENITHA GLENDINNING is a 33 yo female, referred by her primary care physician Dr. Alphonzo Grieve for evaluation of bilateral hands, paresthesia, bilateral lateral thigh area paresthesia  She had past medical history of multiple obesity, had two C. section in 2005, in 2006, continual complains of numbness along surgical site, she had excessive weight gain about 50 pounds over the past 5 years,  Over the past couple years since 2013,, she noticed intermittent numbness of her bilateral fingertips, sometimes woke her up in the middle of the sleep, felt hand with tingling, over the past 1 year, she also noticed intermittent numbness tingling at bilateral lateral thigh area,  She denies significant neck pain, no low back pain, mild gait difficulty due to low peak body habitus, no bowel bladder incontinence.  REVIEW OF SYSTEMS: Full 14 system review of systems performed and notable only for numbness, snoring, depression, too much sleep, not enough sleep, snoring, hearing loss  ALLERGIES: No Known Allergies  HOME MEDICATIONS: Neurontin Flexeril Nabumetone  PAST MEDICAL HISTORY: Past Medical History  Diagnosis Date  . Numbness     PAST SURGICAL HISTORY: Past Surgical History  Procedure Laterality Date  . Cesarean section      FAMILY HISTORY: History reviewed. No pertinent family history.  SOCIAL HISTORY:  History   Social History  . Marital Status: Single    Spouse Name: N/A    Number of Children: 2  . Years of Education: GED   Occupational History  . Not on file.   Social History Main Topics  . Smoking status: Never Smoker   . Smokeless tobacco: Never Used  . Alcohol Use: Yes  . Drug Use: No  . Sexual Activity: Not on file   Other Topics Concern  . Not on file   Social History Narrative   Patient lives at home with her two children.   Patient is unemployed.   Education GED.   Right handed.   PHYSICAL EXAM   Filed Vitals:   07/04/14 1228  BP: 130/79  Pulse: 88  Height: 5\' 4"  (1.626 m)  Weight: 300 lb (136.079 kg)    Not recorded    Body mass index is 51.47 kg/(m^2).   Generalized: In no acute distress  Neck: Supple, no carotid bruits   Cardiac: Regular rate rhythm  Pulmonary: Clear to auscultation bilaterally  Musculoskeletal: No deformity  Neurological examination  Mentation: Alert oriented to time, place, history taking, and causual conversation  Cranial nerve II-XII: Pupils were equal round reactive to light. Extraocular movements were full.  Visual field were full on confrontational test. Bilateral fundi were sharp.  Facial sensation and strength were normal. Hearing was intact to finger rubbing bilaterally. Uvula tongue midline.  Head turning and shoulder shrug and were normal and symmetric.Tongue protrusion into cheek strength was normal.  Motor: Normal tone, bulk and strength.  Sensory: Decreased pinprick at bilateral fingertips, decreased light touch at bilateral lateral thigh area, decreased vibratory sensation at toes.  Coordination: Normal finger to nose, heel-to-shin bilaterally there was no truncal ataxia  Gait: Rising up from seated position without assistance, big body habitus, cautious, steady gait Romberg signs: Negative  Deep tendon reflexes: Brachioradialis 2/2, biceps 2/2, triceps 2/2, patellar 2/2, Achilles trace, plantar responses were flexor bilaterally.   DIAGNOSTIC DATA (LABS, IMAGING, TESTING) - I reviewed patient records, labs, notes, testing and imaging myself where available.  Lab Results  Component Value Date   WBC 8.4 11/11/2013  HGB 12.0 11/11/2013   HCT 36.6 11/11/2013   MCV 80.4 11/11/2013   PLT 278 11/11/2013      Component Value Date/Time   NA 139 11/11/2013 1328   K 3.5* 11/11/2013 1328   CL 102 11/11/2013 1328   CO2 25 11/11/2013 1328   GLUCOSE 99 11/11/2013 1328   BUN 5* 11/11/2013 1328   CREATININE 0.52 11/11/2013 1328   CALCIUM  9.2 11/11/2013 1328   PROT 7.9 06/06/2008 1920   ALBUMIN 3.7 06/06/2008 1920   AST 18 06/06/2008 1920   ALT 15 06/06/2008 1920   ALKPHOS 94 06/06/2008 1920   BILITOT 0.9 06/06/2008 1920   GFRNONAA >90 11/11/2013 1328   GFRAA >90 11/11/2013 1328    ASSESSMENT AND PLAN  BRIEONNA CRUTCHER is a 33 y.o. 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 Ph.D.  [facility] [address], [place], [place] [address] [phone]

## 2014-07-16 ENCOUNTER — Ambulatory Visit (INDEPENDENT_AMBULATORY_CARE_PROVIDER_SITE_OTHER): Payer: Medicaid Other | Admitting: Neurology

## 2014-07-16 ENCOUNTER — Encounter (INDEPENDENT_AMBULATORY_CARE_PROVIDER_SITE_OTHER): Payer: Self-pay

## 2014-07-16 DIAGNOSIS — Z0289 Encounter for other administrative examinations: Secondary | ICD-10-CM

## 2014-07-16 DIAGNOSIS — M545 Low back pain, unspecified: Secondary | ICD-10-CM

## 2014-07-16 DIAGNOSIS — R200 Anesthesia of skin: Secondary | ICD-10-CM

## 2014-07-16 NOTE — Procedures (Signed)
   NCS (NERVE CONDUCTION STUDY) WITH EMG (ELECTROMYOGRAPHY) REPORT   STUDY DATE: Oct 20th 2015 PATIENT NAME: Sherry Morris DOB: July 05, 1981 MRN: [id number]    TECHNOLOGIST: Laretta Alstrom ELECTROMYOGRAPHER: Marcial Pacas M.D.  CLINICAL INFORMATION:   33 year old right-handed obese African American female, complains few months history of bilateral fingertips paresthesia, nocturnal discomfort, right worse than left  On examination:. There was no weakness at bilateral abductor pollicis brevis, opponents, right wrist and Tinel signs.  FINDINGS: NERVE CONDUCTION STUDY:  Bilateral ulnar sensory and motor responses were normal. Left median sensory and motor responses were normal. Right median sensory response showed mildly prolonged peak latency, was preserved snap amplitude. Right median motor response showed mildly prolonged distal latency, with normal conduction velocity, C. map amplitude.  NEEDLE ELECTROMYOGRAPHY: Selected needle examination was performed that right upper extremity muscles, right cervical paraspinal muscles.  Needle examination of right pronator teres, brachioradialis, biceps, deltoid, triceps was normal.  There was no spontaneous activity at right cervical paraspinal muscles, right C6, C7.  Right abductor pollicis brevis: Normally insertion activity, no spontaneous activity, normal morphology mild enlarged motor unit potential, with slight decreased recruitment patterns.  IMPRESSION:   This is a mild abnormal study. 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  INTERPRETING PHYSICIAN:   Marcial Pacas M.D. Ph.D. [facility] [address] [place], [place] [address] [phone]

## 2014-07-24 ENCOUNTER — Telehealth: Payer: Self-pay | Admitting: Neurology

## 2014-07-24 NOTE — Telephone Encounter (Signed)
Patient calling to ask where she can get her brace for her carpal tunnel at a medical supply store that will accept her insurance, please return call and advise.

## 2014-07-25 NOTE — Telephone Encounter (Signed)
Called patient back to let her no we got her message and will call her by Friday with a response. Patient understood and she was fine with this.

## 2014-07-25 NOTE — Telephone Encounter (Signed)
Dana: Please let patient know, I have refilled prescription for wrist splint, you can mail the prescription to her, she may take it to pharmacy, or medical supply store to have it filled

## 2014-08-16 ENCOUNTER — Emergency Department (HOSPITAL_COMMUNITY)
Admission: EM | Admit: 2014-08-16 | Discharge: 2014-08-16 | Disposition: A | Discharge status: Home / Self Care | Payer: Medicaid Other | Attending: Emergency Medicine | Admitting: Emergency Medicine

## 2014-08-16 ENCOUNTER — Emergency Department (HOSPITAL_COMMUNITY)
Admission: EM | Admit: 2014-08-16 | Discharge: 2014-08-16 | Disposition: A | Discharge status: Nursing Home (Includes ICF) | Payer: Medicaid Other | Source: Home / Self Care | Attending: Family Medicine | Admitting: Family Medicine

## 2014-08-16 ENCOUNTER — Encounter (HOSPITAL_COMMUNITY): Payer: Self-pay | Admitting: *Deleted

## 2014-08-16 ENCOUNTER — Encounter (HOSPITAL_COMMUNITY): Payer: Self-pay | Admitting: Emergency Medicine

## 2014-08-16 ENCOUNTER — Emergency Department (HOSPITAL_COMMUNITY): Payer: Medicaid Other

## 2014-08-16 DIAGNOSIS — R079 Chest pain, unspecified: Secondary | ICD-10-CM

## 2014-08-16 DIAGNOSIS — M436 Torticollis: Secondary | ICD-10-CM | POA: Diagnosis not present

## 2014-08-16 DIAGNOSIS — M25512 Pain in left shoulder: Secondary | ICD-10-CM | POA: Diagnosis not present

## 2014-08-16 DIAGNOSIS — R0789 Other chest pain: Secondary | ICD-10-CM

## 2014-08-16 LAB — I-STAT TROPONIN, ED: Troponin i, poc: 0 ng/mL (ref 0.00–0.08)

## 2014-08-16 LAB — CBC
HCT: 33.9 % — ABNORMAL LOW (ref 36.0–46.0)
Hemoglobin: 10.8 g/dL — ABNORMAL LOW (ref 12.0–15.0)
MCH: 24.7 pg — ABNORMAL LOW (ref 26.0–34.0)
MCHC: 31.9 g/dL (ref 30.0–36.0)
MCV: 77.4 fL — ABNORMAL LOW (ref 78.0–100.0)
PLATELETS: 256 10*3/uL (ref 150–400)
RBC: 4.38 MIL/uL (ref 3.87–5.11)
RDW: 14 % (ref 11.5–15.5)
WBC: 9.5 10*3/uL (ref 4.0–10.5)

## 2014-08-16 LAB — BASIC METABOLIC PANEL
ANION GAP: 13 (ref 5–15)
BUN: 7 mg/dL (ref 6–23)
CO2: 24 mEq/L (ref 19–32)
CREATININE: 0.6 mg/dL (ref 0.50–1.10)
Calcium: 9.1 mg/dL (ref 8.4–10.5)
Chloride: 102 mEq/L (ref 96–112)
GFR calc Af Amer: >90 mL/min (ref >90)
GFR calc non Af Amer: >90 mL/min (ref >90)
Glucose, Bld: 126 mg/dL — ABNORMAL HIGH (ref 70–99)
POTASSIUM: 3.7 meq/L (ref 3.7–5.3)
SODIUM: 139 meq/L (ref 137–147)

## 2014-08-16 MED ORDER — NAPROXEN 500 MG PO TABS: 500.0000 mg | ORAL_TABLET | Freq: Two times a day (BID) | ORAL | Status: DC

## 2014-08-16 MED ORDER — HYDROCODONE-ACETAMINOPHEN 5-325 MG PO TABS
1.0000 | ORAL_TABLET | Freq: Once | ORAL | Status: AC
Start: 2014-08-16 — End: 2014-08-16
  Administered 2014-08-16: 1 via ORAL
  Filled 2014-08-16: qty 1

## 2014-08-16 MED ORDER — HYDROCODONE-ACETAMINOPHEN 5-325 MG PO TABS: 1.0000 | ORAL_TABLET | ORAL | Status: DC | PRN

## 2014-08-16 NOTE — ED Provider Notes (Signed)
Sherry Morris is a 33 y.o. female who presents to Urgent Care today for chest pain. Patient notes severe central chest pain radiating to the left shoulder present for the last 2 days. The pain is worse with deep inspiration coughing and swallowing. She feels as though there is something stuck in her throat but is able to eat and drink normally. She was seen by her primary care provider for this issue yesterday. EKG at that clinic was normal reportedly. She was treated with a pain shot and a muscle relaxer. She is not sure of either medicine. These did not help much. She denies any trouble breathing or shortness of breath. She denies any lower extremity swelling or edema.   Past Medical History  Diagnosis Date  . Numbness    Past Surgical History  Procedure Laterality Date  . Cesarean section     History  Substance Use Topics  . Smoking status: Never Smoker   . Smokeless tobacco: Never Used  . Alcohol Use: Yes     Comment: occassional   ROS as above Medications: No current facility-administered medications for this encounter.   No current outpatient prescriptions on file.   No Known Allergies   Exam:  BP 107/69 mmHg  Pulse 105  Temp(Src) 98.2 F (36.8 C) (Oral)  Resp 18  SpO2 98%  LMP 08/14/2014 Gen: Well NAD HEENT: EOMI,  MMM Lungs: Normal work of breathing. CTABL Heart: Tachycardia but regular no MRG Abd: NABS, Soft. Nondistended, Nontender Exts: Brisk capillary refill, warm and well perfused. No edema  Twelve-lead EKG shows normal sinus rhythm at 98 bpm. No ST segment elevation or depression. Normal-appearing T waves. No Q waves. QTC 495  No results found for this or any previous visit (from the past 24 hour(s)). No results found.  Assessment and Plan: 33 y.o. female with chest pain. Concern for esophagitis versus PE. Transfer to the emergency department via shuttle for evaluation and management.  Discussed warning signs or symptoms. Please see discharge  instructions. Patient expresses understanding.     Gregor Hams, MD 08/16/14 [phone]

## 2014-08-16 NOTE — ED Notes (Signed)
Dr Jeneen Rinks shown dr Marthann Schiller from [facility], was told not to repeat it.

## 2014-08-16 NOTE — ED Notes (Signed)
Pt sent here from [facility] for further eval. Pt having upper chest pain x 2 days, pain into left neck and shoulder. Pt reports pain with moving and swallowing. ekg done at [facility] and  [facility]. Airway intact and no acute distress noted.

## 2014-08-16 NOTE — Discharge Instructions (Signed)
Chest Wall Pain Chest wall pain is pain in or around the bones and muscles of your chest. It may take up to 6 weeks to get better. It may take longer if you must stay physically active in your work and activities.  CAUSES  Chest wall pain may happen on its own. However, it may be caused by:  A viral illness like the flu.  Injury.  Coughing.  Exercise.  Arthritis.  Fibromyalgia.  Shingles. HOME CARE INSTRUCTIONS   Avoid overtiring physical activity. Try not to strain or perform activities that cause pain. This includes any activities using your chest or your abdominal and side muscles, especially if heavy weights are used.  Put ice on the sore area.  Put ice in a plastic bag.  Place a towel between your skin and the bag.  Leave the ice on for 15-20 minutes per hour while awake for the first 2 days.  Only take over-the-counter or prescription medicines for pain, discomfort, or fever as directed by your caregiver. SEEK IMMEDIATE MEDICAL CARE IF:   Your pain increases, or you are very uncomfortable.  You have a fever.  Your chest pain becomes worse.  You have new, unexplained symptoms.  You have nausea or vomiting.  You feel sweaty or lightheaded.  You have a cough with phlegm (sputum), or you cough up blood. MAKE SURE YOU:   Understand these instructions.  Will watch your condition.  Will get help right away if you are not doing well or get worse. Document Released: 09/13/2005 Document Revised: 12/06/2011 Document Reviewed: 05/10/2011 [facility] Patient Information 2015 [place], [place]. This information is not intended to replace advice given to you by your health care provider. Make sure you discuss any questions you have with your health care provider.  Torticollis, Acute You have suddenly (acutely) developed a twisted neck (torticollis). This is usually a self-limited condition. CAUSES  Acute torticollis may be caused by malposition, trauma or infection. Most  commonly, acute torticollis is caused by sleeping in an awkward position. Torticollis may also be caused by the flexion, extension or twisting of the neck muscles beyond their normal position. Sometimes, the exact cause may not be known. SYMPTOMS  Usually, there is pain and limited movement of the neck. Your neck may twist to one side. DIAGNOSIS  The diagnosis is often made by physical examination. X-rays, CT scans or MRIs may be done if there is a history of trauma or concern of infection. TREATMENT  For a common, stiff neck that develops during sleep, treatment is focused on relaxing the contracted neck muscle. Medications (including shots) may be used to treat the problem. Most cases resolve in several days. Torticollis usually responds to conservative physical therapy. If left untreated, the shortened and spastic neck muscle can cause deformities in the face and neck. Rarely, surgery is required. HOME CARE INSTRUCTIONS   Use over-the-counter and prescription medications as directed by your caregiver.  Do stretching exercises and massage the neck as directed by your caregiver.  Follow up with physical therapy if needed and as directed by your caregiver. SEEK IMMEDIATE MEDICAL CARE IF:   You develop difficulty breathing or noisy breathing (stridor).  You drool, develop trouble swallowing or have pain with swallowing.  You develop numbness or weakness in the hands or feet.  You have changes in speech or vision.  You have problems with urination or bowel movements.  You have difficulty walking.  You have a fever.  You have increased pain. MAKE SURE YOU:  Understand these instructions.  Will watch your condition.  Will get help right away if you are not doing well or get worse. Document Released: 09/10/2000 Document Revised: 12/06/2011 Document Reviewed: 10/22/2009 [facility] Patient Information 2015 [place], [place]. This information is not intended to replace advice given to you  by your health care provider. Make sure you discuss any questions you have with your health care provider.

## 2014-08-16 NOTE — ED Provider Notes (Signed)
CSN: [id number]     Arrival date & time 08/16/14  1557 History   First MD Initiated Contact with Patient 08/16/14 1800     Chief Complaint  Patient presents with  . Chest Pain  . Neck Pain  . Shoulder Pain      HPI  She presents for evaluation of left anterior chest and neck pain. Seen by primary care physician 2 days ago. Seen earlier today at urgent care. Given a shot of Toradol 2 days ago on her 24 office and discharged with a muscle relaxant. Seen and evaluated at urgent care and transferred here for further evaluation after initial testing there was unrevealing.  Patient states that 2 days ago on Wednesday morning she awakened and had some pain in the left side of her neck. She states it hurt to move her neck and cannot move her neck to look to her left or to her right. She's not had fever. She's had no falls or injuries or trauma. She had no pain or chest or any difficult breathing. She went to her physician's office that same day. She states the wait was too long so she went home. Was seen then yesterday, Thursday. Given the Toradol and discharged. She states that she took one dose of the muscle relaxant this morning that was given her by her physician yesterday and it is no better. She states it feels funny in the throat when she swallows her throat does not hurt. She has no dysphagia. She is not short of breath coughing or febrile. She has no dyspnea on exertion. She has no suggestive past medical history. No family history for DVT or PE. No recent prolonged immobilization cast splints fractures surgeries malignancies hemoptysis or pregnancy.    Past Medical History  Diagnosis Date  . Numbness    Past Surgical History  Procedure Laterality Date  . Cesarean section     History reviewed. No pertinent family history. History  Substance Use Topics  . Smoking status: Never Smoker   . Smokeless tobacco: Never Used  . Alcohol Use: Yes     Comment: occassional   OB  History    No data available     Review of Systems  Constitutional: Negative for fever, chills, diaphoresis, appetite change and fatigue.  HENT: Negative for mouth sores, sore throat and trouble swallowing.   Eyes: Negative for visual disturbance.  Respiratory: Negative for cough, chest tightness, shortness of breath and wheezing.   Cardiovascular: Negative for chest pain.  Gastrointestinal: Negative for nausea, vomiting, abdominal pain, diarrhea and abdominal distention.  Endocrine: Negative for polydipsia, polyphagia and polyuria.  Genitourinary: Negative for dysuria, frequency and hematuria.  Musculoskeletal: Positive for myalgias, arthralgias and neck stiffness. Negative for gait problem.  Skin: Negative for color change, pallor and rash.  Neurological: Negative for dizziness, syncope, light-headedness and headaches.  Hematological: Does not bruise/bleed easily.  Psychiatric/Behavioral: Negative for behavioral problems and confusion.      Allergies  Review of patient's allergies indicates no known allergies.  Home Medications   Prior to Admission medications   Medication Sig Start Date End Date Taking? Authorizing Provider  HYDROcodone-acetaminophen (NORCO/VICODIN) 5-325 MG per tablet Take 1 tablet by mouth every 4 (four) hours as needed. 08/16/14   Tanna Furry, MD  naproxen (NAPROSYN) 500 MG tablet Take 1 tablet (500 mg total) by mouth 2 (two) times daily. 08/16/14   Tanna Furry, MD   BP 124/68 mmHg  Pulse 95  Temp(Src) 98 F (36.7 C) (Oral)  Resp 20  Ht 5\' 4"  (1.626 m)  Wt 298 lb (135.172 kg)  BMI 51.13 kg/m2  SpO2 99%  LMP 08/14/2014 Physical Exam  Constitutional: She is oriented to person, place, and time. She appears well-developed and well-nourished. No distress.  HENT:  Head: Normocephalic.  Eyes: Conjunctivae are normal. Pupils are equal, round, and reactive to light. No scleral icterus.  Neck: Normal range of motion. Neck supple. No thyromegaly present.     Reducible tenderness throughout the left lateral neck musculature and down the sternocleidomastoid to the chest. No subcutaneous air. Is limited range of motion to rotate. Normal flexion extension.  Cardiovascular: Normal rate and regular rhythm.  Exam reveals no gallop and no friction rub.   No murmur heard. Pulmonary/Chest: Effort normal and breath sounds normal. No respiratory distress. She has no wheezes. She has no rales.  Abdominal: Soft. Bowel sounds are normal. She exhibits no distension. There is no tenderness. There is no rebound.  Musculoskeletal: Normal range of motion.  Neurological: She is alert and oriented to person, place, and time.  Skin: Skin is warm and dry. No rash noted.  Psychiatric: She has a normal mood and affect. Her behavior is normal.    ED Course  Procedures (including critical care time) Labs Review Labs Reviewed  CBC - Abnormal; Notable for the following:    Hemoglobin 10.8 (*)    HCT 33.9 (*)    MCV 77.4 (*)    MCH 24.7 (*)    All other components within normal limits  BASIC METABOLIC PANEL - Abnormal; Notable for the following:    Glucose, Bld 126 (*)    All other components within normal limits  I-STAT TROPOININ, ED    Imaging Review Dg Chest 2 View  08/16/2014   CLINICAL DATA:  Left-sided chest pain extends into the neck and shoulder for 2 days.  EXAM: CHEST  2 VIEW  COMPARISON:  11/11/2013  FINDINGS: Heart size and pulmonary vascularity are normal and the lungs are clear. No effusions. No osseous abnormality. There is an indentation upon the left side of the trachea above the thoracic inlet with deviation of the trachea to the right. This indicates probable enlargement of the left lobe of the thyroid gland.  IMPRESSION: Probable enlargement of the left lobe of the thyroid gland. Otherwise, normal chest.   Electronically Signed   By: Rozetta Nunnery M.D.   On: 08/16/2014 17:03     EKG Interpretation None      MDM   Final diagnoses:   Torticollis  Muscular chest pain    EKG shows no acute or ischemic changes. Normal troponin. Normal chest x-ray and additional labs normal as well. I'm not concerned this is PE, ACS, pneumonia or meningitis. I've ambulated her around our department. Heart rate is 85. Her oxygenation is 97%. I think she needs additional time, anti-inflammatories, and continued muscle relaxant for her acute spasmodic torticollis.    Tanna Furry, MD 08/16/14 Lurena Nida

## 2014-08-16 NOTE — ED Notes (Signed)
Pt states that she has been having chest pain since Wednesday morning. Pt states that she was at her PCP yesterday with same chief complaint

## 2014-08-16 NOTE — ED Notes (Signed)
Spoke with 1st nurse Caryl Pina RN gave chief complaint and brief history on pt. Pt being sent to [facility] via shuttle.

## 2014-11-29 ENCOUNTER — Emergency Department (HOSPITAL_COMMUNITY)
Admission: EM | Admit: 2014-11-29 | Discharge: 2014-11-30 | Disposition: A | Discharge status: Home / Self Care | Payer: Medicaid Other | Attending: Emergency Medicine | Admitting: Emergency Medicine

## 2014-11-29 ENCOUNTER — Encounter (HOSPITAL_COMMUNITY): Payer: Self-pay | Admitting: Emergency Medicine

## 2014-11-29 DIAGNOSIS — S199XXA Unspecified injury of neck, initial encounter: Secondary | ICD-10-CM | POA: Insufficient documentation

## 2014-11-29 DIAGNOSIS — S24109A Unspecified injury at unspecified level of thoracic spinal cord, initial encounter: Secondary | ICD-10-CM | POA: Insufficient documentation

## 2014-11-29 DIAGNOSIS — S4992XA Unspecified injury of left shoulder and upper arm, initial encounter: Secondary | ICD-10-CM | POA: Insufficient documentation

## 2014-11-29 DIAGNOSIS — Y939 Activity, unspecified: Secondary | ICD-10-CM | POA: Diagnosis not present

## 2014-11-29 DIAGNOSIS — Y999 Unspecified external cause status: Secondary | ICD-10-CM | POA: Insufficient documentation

## 2014-11-29 DIAGNOSIS — S3992XA Unspecified injury of lower back, initial encounter: Secondary | ICD-10-CM | POA: Insufficient documentation

## 2014-11-29 DIAGNOSIS — Y92410 Unspecified street and highway as the place of occurrence of the external cause: Secondary | ICD-10-CM | POA: Diagnosis not present

## 2014-11-29 MED ORDER — ACETAMINOPHEN 325 MG PO TABS
650.0000 mg | ORAL_TABLET | Freq: Once | ORAL | Status: AC
  Administered 2014-11-29: 650 mg via ORAL
  Filled 2014-11-29: qty 2
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
## 2014-11-30 LAB — I-STAT BETA HCG BLOOD, ED (MC, WL, AP ONLY): I-stat hCG, quantitative: <5 m[IU]/mL (ref <5)

## 2014-11-30 MED ORDER — METHOCARBAMOL 500 MG PO TABS: 500.0000 mg | ORAL_TABLET | Freq: Two times a day (BID) | ORAL | Status: DC

## 2014-11-30 MED ORDER — NAPROXEN 500 MG PO TABS: 500.0000 mg | ORAL_TABLET | Freq: Two times a day (BID) | ORAL | Status: DC
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
## 2015-03-11 IMAGING — DX DG CHEST 2V
2 series · 2 of 2 positions shown · non-contrast
Comparison: 11/11/2013

CLINICAL DATA: Left-sided chest pain extends into the neck and
shoulder for 2 days.

EXAM:
CHEST  2 VIEW

[chest pa]
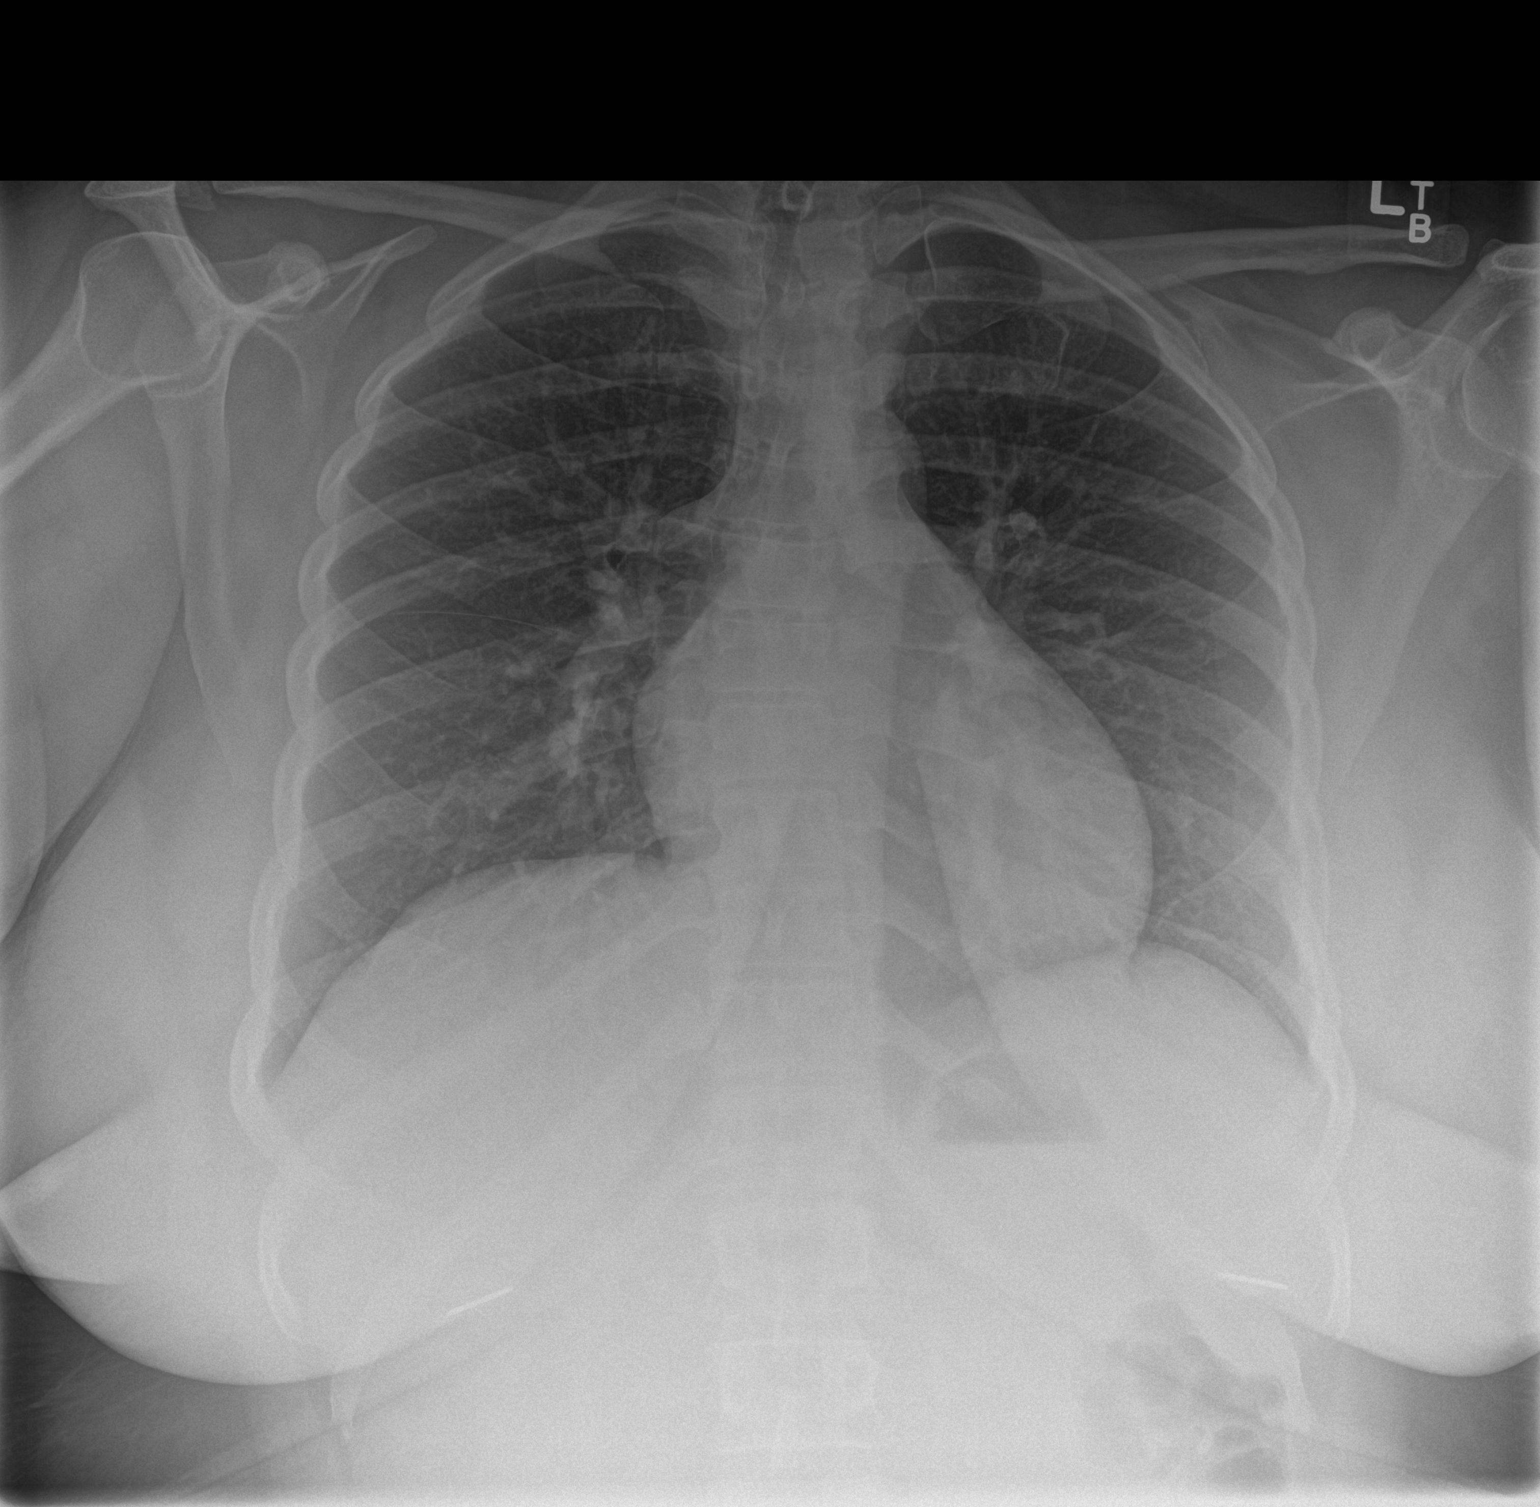

[chest lat]
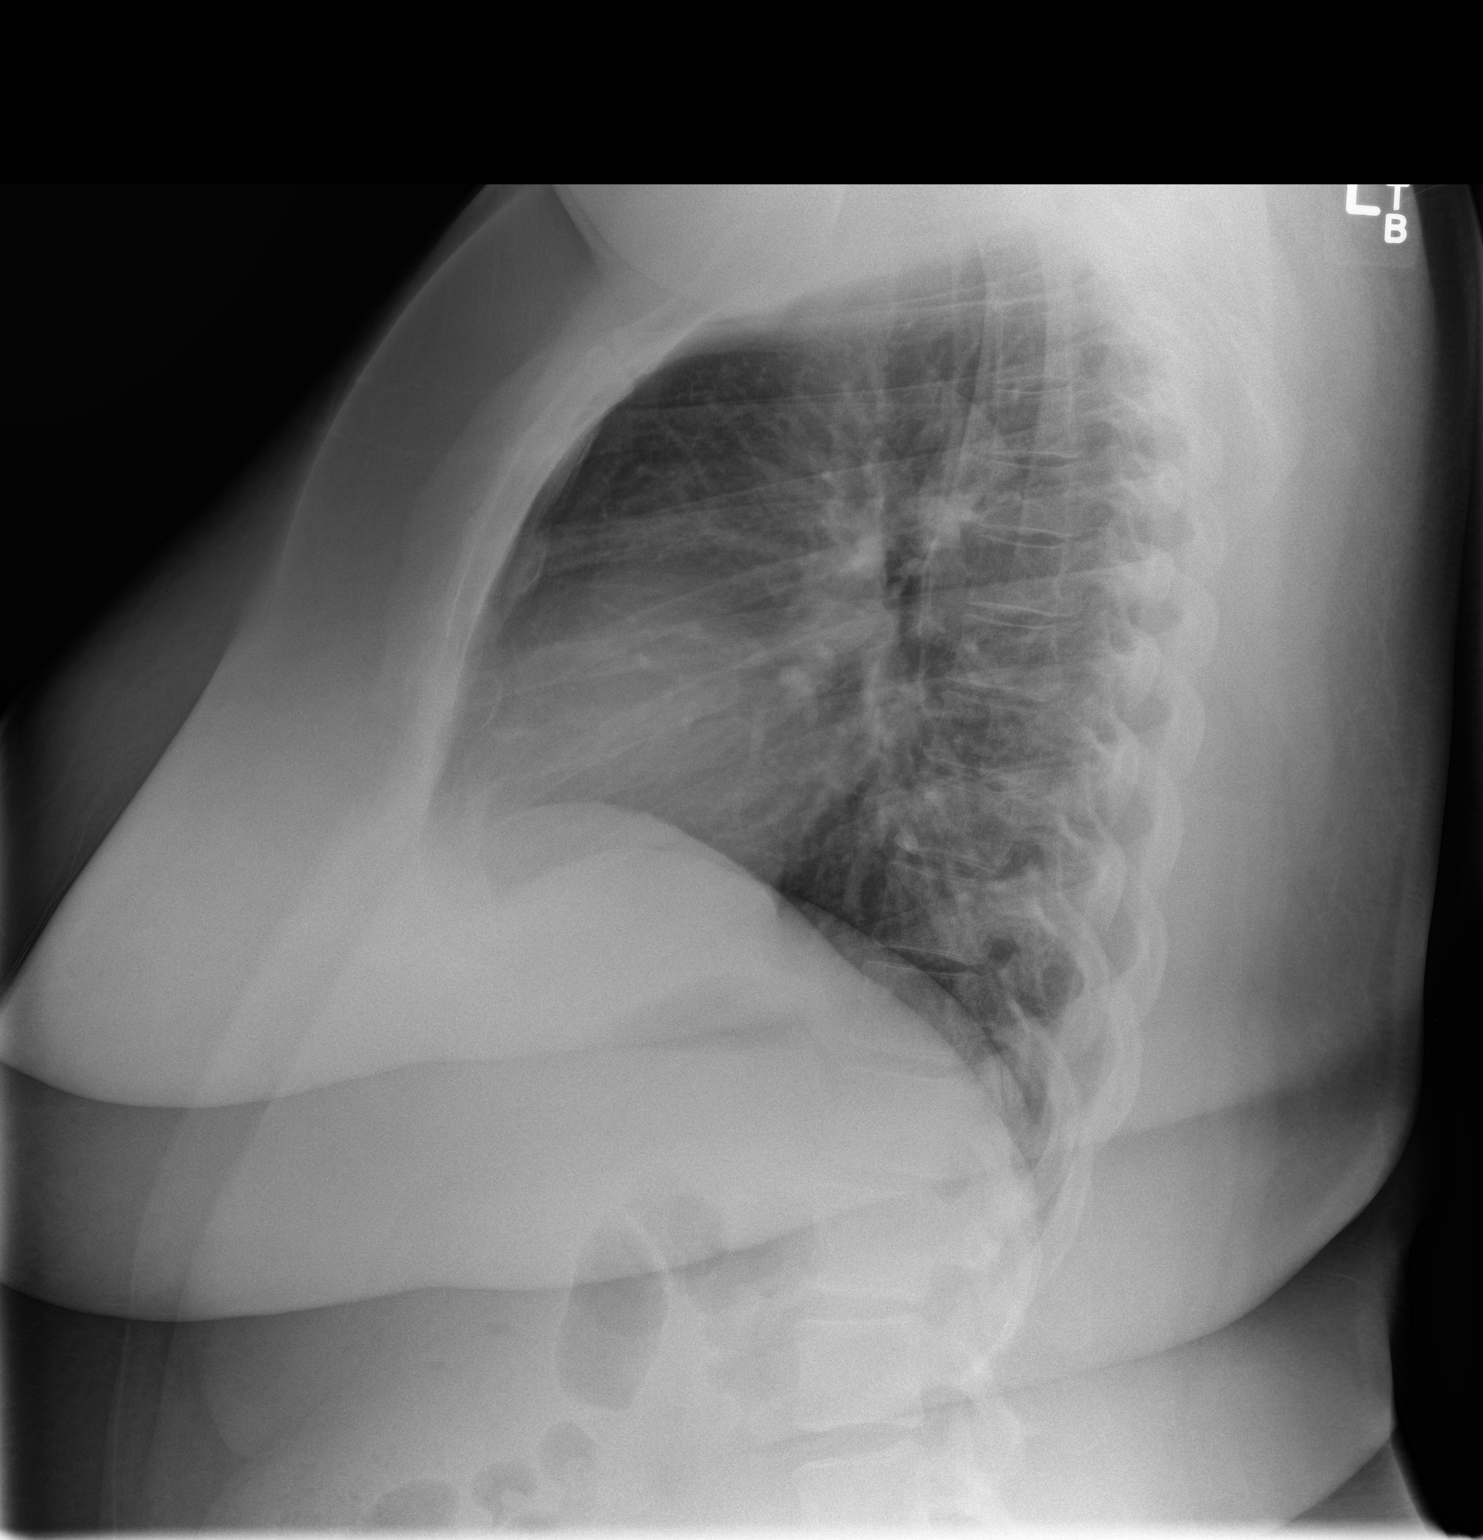

[2 of 2 positions shown; findings below may reference images not displayed]

FINDINGS: Heart size and pulmonary vascularity are normal and the lungs are
clear. No effusions. No osseous abnormality. There is an indentation
upon the left side of the trachea above the thoracic inlet with
deviation of the trachea to the right. This indicates probable
enlargement of the left lobe of the thyroid gland.
IMPRESSION: Probable enlargement of the left lobe of the thyroid gland.
Otherwise, normal chest.

## 2015-07-08 ENCOUNTER — Encounter (HOSPITAL_COMMUNITY): Payer: Self-pay | Admitting: *Deleted

## 2015-07-08 ENCOUNTER — Emergency Department (HOSPITAL_COMMUNITY)
Admission: EM | Admit: 2015-07-08 | Discharge: 2015-07-08 | Disposition: A | Discharge status: Home / Self Care | Payer: Medicaid Other | Attending: Emergency Medicine | Admitting: Emergency Medicine

## 2015-07-08 ENCOUNTER — Emergency Department (HOSPITAL_COMMUNITY): Payer: Medicaid Other

## 2015-07-08 DIAGNOSIS — Z79899 Other long term (current) drug therapy: Secondary | ICD-10-CM | POA: Diagnosis not present

## 2015-07-08 DIAGNOSIS — X58XXXA Exposure to other specified factors, initial encounter: Secondary | ICD-10-CM | POA: Insufficient documentation

## 2015-07-08 DIAGNOSIS — S6991XA Unspecified injury of right wrist, hand and finger(s), initial encounter: Secondary | ICD-10-CM | POA: Diagnosis present

## 2015-07-08 DIAGNOSIS — Z8669 Personal history of other diseases of the nervous system and sense organs: Secondary | ICD-10-CM | POA: Insufficient documentation

## 2015-07-08 DIAGNOSIS — S63616A Unspecified sprain of right little finger, initial encounter: Secondary | ICD-10-CM | POA: Insufficient documentation

## 2015-07-08 DIAGNOSIS — Y929 Unspecified place or not applicable: Secondary | ICD-10-CM | POA: Diagnosis not present

## 2015-07-08 DIAGNOSIS — Y999 Unspecified external cause status: Secondary | ICD-10-CM | POA: Insufficient documentation

## 2015-07-08 DIAGNOSIS — S63619A Unspecified sprain of unspecified finger, initial encounter: Secondary | ICD-10-CM

## 2015-07-08 DIAGNOSIS — Y9389 Activity, other specified: Secondary | ICD-10-CM | POA: Insufficient documentation

## 2015-07-08 DIAGNOSIS — G5600 Carpal tunnel syndrome, unspecified upper limb: Secondary | ICD-10-CM | POA: Insufficient documentation

## 2015-07-08 DIAGNOSIS — M79644 Pain in right finger(s): Secondary | ICD-10-CM

## 2015-07-08 HISTORY — DX: Carpal tunnel syndrome, unspecified upper limb: G56.00

## 2015-07-08 MED ORDER — HYDROCODONE-ACETAMINOPHEN 5-325 MG PO TABS
1.0000 | ORAL_TABLET | Freq: Once | ORAL | Status: AC
Start: 2015-07-08 — End: 2015-07-08
  Administered 2015-07-08: 1 via ORAL
  Filled 2015-07-08: qty 1

## 2015-07-08 MED ORDER — DICLOFENAC SODIUM 50 MG PO TBEC: 50.0000 mg | DELAYED_RELEASE_TABLET | Freq: Two times a day (BID) | ORAL | Status: DC

## 2015-07-08 NOTE — ED Notes (Signed)
Declined W/C at D/C and was escorted to lobby by RN. 

## 2015-07-08 NOTE — ED Notes (Signed)
PT reorts pain to RT small finger since Monday . Pt does not remember any injury.

## 2015-07-08 NOTE — ED Provider Notes (Signed)
CSN: [id number]     Arrival date & time 07/08/15  1312 History  By signing my name below, I, Sherry Morris, attest that this documentation has been prepared under the direction and in the presence of Sherry Baller, NP. Electronically Signed: Stephania Morris, ED Scribe. 07/08/2015. 2:01 PM.    Chief Complaint  Patient presents with  . Hand Pain   Patient is a 34 y.o. female presenting with hand pain. The history is provided by the patient. No language interpreter was used.  Hand Pain This is a new problem. The current episode started yesterday. The problem occurs constantly. The problem has been gradually worsening. Pertinent negatives include no chest pain, no abdominal pain, no headaches and no shortness of breath. Nothing aggravates the symptoms. Nothing relieves the symptoms. She has tried nothing for the symptoms.   HPI Comments: Sherry Morris is a 34 y.o. female with a history of carpal tunnel syndrome, who presents to the Emergency Department complaining of sudden-onset, aching, right little finger pain and swelling that began yesterday. Patient states she was cracking her knuckles, which she normally does out of habit due to her carpal tunnel syndrome, and she thinks she may have accidentally cracked her right fifth finger knuckle too hard. She denies any other injuries or complaints. She states she did not take anything for this PTA.  Past Medical History  Diagnosis Date  . Numbness   . Carpal tunnel syndrome    Past Surgical History  Procedure Laterality Date  . Cesarean section     History reviewed. No pertinent family history. Social History  Substance Use Topics  . Smoking status: Never Smoker   . Smokeless tobacco: Never Used  . Alcohol Use: Yes     Comment: occassional   OB History    No data available     Review of Systems  Respiratory: Negative for shortness of breath.   Cardiovascular: Negative for chest pain.  Gastrointestinal: Negative for abdominal pain.   Musculoskeletal: Positive for joint swelling and arthralgias (right little finger pain).  Neurological: Negative for headaches.  All other systems reviewed and are negative.   Allergies  Review of patient's allergies indicates no known allergies.  Home Medications   Prior to Admission medications   Medication Sig Start Date End Date Taking? Authorizing Provider  diclofenac (VOLTAREN) 50 MG EC tablet Take 1 tablet (50 mg total) by mouth 2 (two) times daily. 07/08/15   Lyndsie Wallman Bunnie Pion, NP  HYDROcodone-acetaminophen (NORCO/VICODIN) 5-325 MG per tablet Take 1 tablet by mouth every 4 (four) hours as needed. Patient not taking: Reported on 11/29/2014 08/16/14   Tanna Furry, MD  methocarbamol (ROBAXIN) 500 MG tablet Take 1 tablet (500 mg total) by mouth 2 (two) times daily. 11/30/14   Jennifer Piepenbrink, PA-C   BP 129/72 mmHg  Pulse 99  Temp(Src) 98.4 F (36.9 C) (Oral)  Resp 20  Ht 5\' 4"  (1.626 m)  Wt 303 lb (137.44 kg)  BMI 51.98 kg/m2  SpO2 99%  LMP 06/19/2015 Physical Exam  Constitutional: She is oriented to person, place, and time. She appears well-developed and well-nourished. No distress.  HENT:  Head: Normocephalic and atraumatic.  Eyes: EOM are normal.  Neck: Neck supple.  Cardiovascular: Normal rate.   Pulmonary/Chest: Effort normal.  Musculoskeletal: Normal range of motion. She exhibits tenderness. She exhibits no edema.  Radial pulses are 2+. Adequate circulation good touch sensation. Tenderness and swelling to the PIP of the right little finger. No tenderness over the dorsum of  the hand.  Neurological: She is alert and oriented to person, place, and time. No cranial nerve deficit.  Skin: Skin is warm and dry.  Psychiatric: She has a normal mood and affect. Her behavior is normal.  Nursing note and vitals reviewed.   ED Course  Procedures (including critical care time)  DIAGNOSTIC STUDIES: Oxygen Saturation is 100% on RA, normal by my interpretation.    COORDINATION  OF CARE: 1:57 PM - Discussed treatment plan with pt at bedside which includes XR to r/o dislocation and/or fracture. Pt verbalized understanding and agreed to plan.   3:05 PM - XR findings reviewed with pt. Negative for fracture. Treatment plan includes finger splint. Pt verbalized understanding and agreed to plan.  Imaging Review Dg Finger Little Right  07/08/2015   CLINICAL DATA:  Right fifth finger pain and swelling.  EXAM: RIGHT LITTLE FINGER 2+V  COMPARISON:  None.  FINDINGS: There is no evidence of fracture or dislocation. There is no evidence of arthropathy or other focal bone abnormality. Soft tissues are unremarkable.  IMPRESSION: Negative.   Electronically Signed   By: Sherry Morris M.D.   On: 07/08/2015 14:51   I have personally reviewed and evaluated these images and lab results as part of my medical decision-making.  MDM  34 y.o. female with pain and swelling of the right little finger after she was popping her knuckles. Placed in finger splint and treated for pain and and inflammation. She will follow up with ortho if symptoms persist.   Final diagnoses:  Finger pain, right  Finger sprain, initial encounter   I personally performed the services described in this documentation, which was scribed in my presence. The recorded information has been reviewed and is accurate.     [address] [place], NP 07/08/15 [place], MD 07/12/15 [phone]

## 2015-07-08 NOTE — Discharge Instructions (Signed)
Finger Sprain A finger sprain is a tear in one of the strong, fibrous tissues that connect the bones (ligaments) in your finger. The severity of the sprain depends on how much of the ligament is torn. The tear can be either partial or complete. CAUSES  Often, sprains are a result of a fall or accident. If you extend your hands to catch an object or to protect yourself, the force of the impact causes the fibers of your ligament to stretch too much. This excess tension causes the fibers of your ligament to tear. SYMPTOMS  You may have some loss of motion in your finger. Other symptoms include:  Bruising.  Tenderness.  Swelling. DIAGNOSIS  In order to diagnose finger sprain, your caregiver will physically examine your finger or thumb to determine how torn the ligament is. Your caregiver may also suggest an X-ray exam of your finger to make sure no bones are broken. TREATMENT  If your ligament is only partially torn, treatment usually involves keeping the finger in a fixed position (immobilization) for a short period. To do this, your caregiver will apply a bandage, cast, or splint to keep your finger from moving until it heals. For a partially torn ligament, the healing process usually takes 2 to 3 weeks. If your ligament is completely torn, you may need surgery to reconnect the ligament to the bone. After surgery a cast or splint will be applied and will need to stay on your finger or thumb for 4 to 6 weeks while your ligament heals. HOME CARE INSTRUCTIONS  Keep your injured finger elevated, when possible, to decrease swelling.  To ease pain and swelling, apply ice to your joint twice a day, for 2 to 3 days:  Put ice in a plastic bag.  Place a towel between your skin and the bag.  Leave the ice on for 15 minutes.  Only take over-the-counter or prescription medicine for pain as directed by your caregiver.  Do not wear rings on your injured finger.  Do not leave your finger unprotected  until pain and stiffness go away (usually 3 to 4 weeks).  Do not allow your cast or splint to get wet. Cover your cast or splint with a plastic bag when you shower or bathe. Do not swim.  Your caregiver may suggest special exercises for you to do during your recovery to prevent or limit permanent stiffness. SEEK IMMEDIATE MEDICAL CARE IF:  Your cast or splint becomes damaged.  Your pain becomes worse rather than better. MAKE SURE YOU:  Understand these instructions.  Will watch your condition.  Will get help right away if you are not doing well or get worse.   This information is not intended to replace advice given to you by your health care provider. Make sure you discuss any questions you have with your health care provider.   Document Released: 10/21/2004 Document Revised: 10/04/2014 Document Reviewed: 05/17/2011 Elsevier Interactive Patient Education 2016 [employer]  

## 2016-02-02 ENCOUNTER — Emergency Department (HOSPITAL_COMMUNITY): Payer: No Typology Code available for payment source

## 2016-02-02 ENCOUNTER — Encounter (HOSPITAL_COMMUNITY): Payer: Self-pay | Admitting: Emergency Medicine

## 2016-02-02 ENCOUNTER — Emergency Department (HOSPITAL_COMMUNITY)
Admission: EM | Admit: 2016-02-02 | Discharge: 2016-02-02 | Disposition: A | Discharge status: Home / Self Care | Payer: No Typology Code available for payment source | Attending: Emergency Medicine | Admitting: Emergency Medicine

## 2016-02-02 DIAGNOSIS — M545 Low back pain, unspecified: Secondary | ICD-10-CM

## 2016-02-02 DIAGNOSIS — S3992XA Unspecified injury of lower back, initial encounter: Secondary | ICD-10-CM | POA: Diagnosis not present

## 2016-02-02 DIAGNOSIS — R42 Dizziness and giddiness: Secondary | ICD-10-CM | POA: Diagnosis not present

## 2016-02-02 DIAGNOSIS — R51 Headache: Secondary | ICD-10-CM

## 2016-02-02 DIAGNOSIS — Z3202 Encounter for pregnancy test, result negative: Secondary | ICD-10-CM | POA: Insufficient documentation

## 2016-02-02 DIAGNOSIS — R519 Headache, unspecified: Secondary | ICD-10-CM

## 2016-02-02 DIAGNOSIS — S161XXA Strain of muscle, fascia and tendon at neck level, initial encounter: Secondary | ICD-10-CM | POA: Insufficient documentation

## 2016-02-02 DIAGNOSIS — Z79899 Other long term (current) drug therapy: Secondary | ICD-10-CM | POA: Insufficient documentation

## 2016-02-02 DIAGNOSIS — Y998 Other external cause status: Secondary | ICD-10-CM | POA: Diagnosis not present

## 2016-02-02 DIAGNOSIS — S0993XA Unspecified injury of face, initial encounter: Secondary | ICD-10-CM | POA: Insufficient documentation

## 2016-02-02 DIAGNOSIS — S199XXA Unspecified injury of neck, initial encounter: Secondary | ICD-10-CM | POA: Diagnosis present

## 2016-02-02 DIAGNOSIS — S4991XA Unspecified injury of right shoulder and upper arm, initial encounter: Secondary | ICD-10-CM | POA: Insufficient documentation

## 2016-02-02 DIAGNOSIS — Y92410 Unspecified street and highway as the place of occurrence of the external cause: Secondary | ICD-10-CM | POA: Diagnosis not present

## 2016-02-02 DIAGNOSIS — Y9389 Activity, other specified: Secondary | ICD-10-CM | POA: Insufficient documentation

## 2016-02-02 DIAGNOSIS — M25511 Pain in right shoulder: Secondary | ICD-10-CM

## 2016-02-02 LAB — I-STAT BETA HCG BLOOD, ED (MC, WL, AP ONLY)

## 2016-02-02 MED ORDER — IBUPROFEN 800 MG PO TABS
800.0000 mg | ORAL_TABLET | Freq: Once | ORAL | Status: AC
  Administered 2016-02-02: 800 mg via ORAL
  Filled 2016-02-02: qty 1

## 2016-02-02 MED ORDER — ACETAMINOPHEN 325 MG PO TABS
650.0000 mg | ORAL_TABLET | Freq: Once | ORAL | Status: AC
  Administered 2016-02-02: 650 mg via ORAL
  Filled 2016-02-02: qty 2

## 2016-02-02 MED ORDER — IBUPROFEN 800 MG PO TABS: 800.0000 mg | ORAL_TABLET | Freq: Three times a day (TID) | ORAL | Status: DC

## 2016-02-02 MED ORDER — CYCLOBENZAPRINE HCL 10 MG PO TABS: 10.0000 mg | ORAL_TABLET | Freq: Two times a day (BID) | ORAL | Status: DC | PRN
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
## 2016-02-02 NOTE — ED Notes (Signed)
Patient transported to CT 
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
## 2016-04-13 ENCOUNTER — Encounter (HOSPITAL_COMMUNITY): Payer: Self-pay | Admitting: Emergency Medicine

## 2016-04-13 DIAGNOSIS — B9689 Other specified bacterial agents as the cause of diseases classified elsewhere: Secondary | ICD-10-CM | POA: Diagnosis not present

## 2016-04-13 DIAGNOSIS — Z79899 Other long term (current) drug therapy: Secondary | ICD-10-CM | POA: Insufficient documentation

## 2016-04-13 DIAGNOSIS — N760 Acute vaginitis: Secondary | ICD-10-CM | POA: Diagnosis not present

## 2016-04-13 DIAGNOSIS — B373 Candidiasis of vulva and vagina: Secondary | ICD-10-CM | POA: Diagnosis not present

## 2016-04-13 DIAGNOSIS — N898 Other specified noninflammatory disorders of vagina: Secondary | ICD-10-CM | POA: Diagnosis present

## 2016-04-13 LAB — URINALYSIS, ROUTINE W REFLEX MICROSCOPIC
Bilirubin Urine: NEGATIVE
Glucose, UA: NEGATIVE mg/dL
Hgb urine dipstick: NEGATIVE
KETONES UR: NEGATIVE mg/dL
LEUKOCYTES UA: NEGATIVE
NITRITE: NEGATIVE
PH: 6 (ref 5.0–8.0)
Protein, ur: NEGATIVE mg/dL
SPECIFIC GRAVITY, URINE: 1.02 (ref 1.005–1.030)

## 2016-04-13 LAB — POC URINE PREG, ED: Preg Test, Ur: NEGATIVE

## 2016-04-13 NOTE — ED Notes (Signed)
Pt. reports vaginal yeast irritation/discharge onset this week .

## 2016-04-14 ENCOUNTER — Emergency Department (HOSPITAL_COMMUNITY)
Admission: EM | Admit: 2016-04-14 | Discharge: 2016-04-14 | Disposition: A | Discharge status: Home / Self Care | Payer: Medicaid Other | Attending: Emergency Medicine | Admitting: Emergency Medicine

## 2016-04-14 DIAGNOSIS — B9689 Other specified bacterial agents as the cause of diseases classified elsewhere: Secondary | ICD-10-CM

## 2016-04-14 DIAGNOSIS — N760 Acute vaginitis: Secondary | ICD-10-CM

## 2016-04-14 DIAGNOSIS — B373 Candidiasis of vulva and vagina: Secondary | ICD-10-CM

## 2016-04-14 DIAGNOSIS — B3731 Acute candidiasis of vulva and vagina: Secondary | ICD-10-CM

## 2016-04-14 HISTORY — DX: Obesity, unspecified: E66.9

## 2016-04-14 LAB — GC/CHLAMYDIA PROBE AMP (~~LOC~~) NOT AT ARMC
Chlamydia: NEGATIVE
Neisseria Gonorrhea: NEGATIVE

## 2016-04-14 LAB — WET PREP, GENITAL
SPERM: NONE SEEN
TRICH WET PREP: NONE SEEN

## 2016-04-14 LAB — RPR: RPR Ser Ql: NONREACTIVE

## 2016-04-14 LAB — HIV ANTIBODY (ROUTINE TESTING W REFLEX): HIV SCREEN 4TH GENERATION: NONREACTIVE

## 2016-04-14 MED ORDER — METRONIDAZOLE 500 MG PO TABS
500.0000 mg | ORAL_TABLET | Freq: Two times a day (BID) | ORAL | Status: DC
Start: 2016-04-14 — End: 2016-12-05

## 2016-04-14 MED ORDER — FLUCONAZOLE 100 MG PO TABS
200.0000 mg | ORAL_TABLET | Freq: Once | ORAL | Status: AC
  Administered 2016-04-14: 200 mg via ORAL
  Filled 2016-04-14: qty 2

## 2016-04-14 NOTE — ED Notes (Signed)
Patient to the desk asking how long before she would go back.  Stated she was sitting in the waiting room the entire time.

## 2016-04-14 NOTE — ED Provider Notes (Signed)
CSN: [id number]     Arrival date & time 04/13/16  2250 History   First MD Initiated Contact with Patient 04/14/16 0240     Chief Complaint  Patient presents with  . Vaginal Discharge     (Consider location/radiation/quality/duration/timing/severity/associated sxs/prior Treatment) Patient is a 35 y.o. female presenting with vaginal discharge. The history is provided by the patient. No language interpreter was used.  Vaginal Discharge Quality:  White Onset quality:  Gradual Duration:  3 days Timing:  Constant Progression:  Worsening Chronicity:  New Context: spontaneously   Relieved by:  Nothing Worsened by:  Nothing tried Ineffective treatments:  None tried Associated symptoms: vaginal itching   Risk factors: unprotected sex    Sherry Morris is a 35 y.o. G2P2 who presents to the ED with vaginal d/c and itching that started a few days ago. She reports that she has had a yeast infection in the past and this feels similar. She has one sex partner that she has been with off and on for the past several years. She has been back with him for 6 weeks. Hx of trichomonas, last pap smear one year ago and was normal.   Past Medical History  Diagnosis Date  . Numbness   . Carpal tunnel syndrome   . Obesity    Past Surgical History  Procedure Laterality Date  . Cesarean section     No family history on file. Social History  Substance Use Topics  . Smoking status: Never Smoker   . Smokeless tobacco: Never Used  . Alcohol Use: Yes     Comment: occassional   OB History    No data available     Review of Systems  Genitourinary: Positive for vaginal discharge.  all other systems negative    Allergies  Review of patient's allergies indicates no known allergies.  Home Medications   Prior to Admission medications   Medication Sig Start Date End Date Taking? Authorizing Provider  cyclobenzaprine (FLEXERIL) 10 MG tablet Take 1 tablet (10 mg total) by mouth 2 (two) times daily  as needed for muscle spasms. Patient not taking: Reported on 04/14/2016 02/02/16   Gloriann Loan, PA-C  diclofenac (VOLTAREN) 50 MG EC tablet Take 1 tablet (50 mg total) by mouth 2 (two) times daily. Patient not taking: Reported on 02/02/2016 07/08/15   Ashley Murrain, NP  HYDROcodone-acetaminophen (NORCO/VICODIN) 5-325 MG per tablet Take 1 tablet by mouth every 4 (four) hours as needed. Patient not taking: Reported on 11/29/2014 08/16/14   Tanna Furry, MD  ibuprofen (ADVIL,MOTRIN) 800 MG tablet Take 1 tablet (800 mg total) by mouth 3 (three) times daily. Patient not taking: Reported on 04/14/2016 02/02/16   Gloriann Loan, PA-C  methocarbamol (ROBAXIN) 500 MG tablet Take 1 tablet (500 mg total) by mouth 2 (two) times daily. Patient not taking: Reported on 02/02/2016 11/30/14   Anderson Malta Piepenbrink, PA-C   BP 151/99 mmHg  Pulse 98  Temp(Src) 99.4 F (37.4 C) (Oral)  Resp 18  Ht 5\' 4"  (1.626 m)  Wt 133.159 kg  BMI 50.37 kg/m2  SpO2 98%  LMP 03/14/2016 (Approximate) Physical Exam  Constitutional: She is oriented to person, place, and time. No distress.  Morbidly obese  HENT:  Head: Normocephalic.  Eyes: EOM are normal.  Neck: Neck supple.  Cardiovascular: Normal rate.   Pulmonary/Chest: Effort normal.  Abdominal: Soft. There is no tenderness.  Genitourinary:  External genitalia without lesions, thick white malodorous discharge vaginal vault. No CMT, no adnexal tenderness, unable to  palpate uterus due to patient habitus.   Musculoskeletal: Normal range of motion.  Neurological: She is alert and oriented to person, place, and time. No cranial nerve deficit.  Skin: Skin is warm and dry.  Psychiatric: She has a normal mood and affect. Her behavior is normal.  Nursing note and vitals reviewed.   ED Course  Procedures (including critical care time) Labs Review Results for orders placed or performed during the hospital encounter of 04/14/16 (from the past 24 hour(s))  Urinalysis, Routine w reflex  microscopic (not at [facility])     Status: None   Collection Time: 04/13/16 10:55 PM  Result Value Ref Range   Color, Urine YELLOW YELLOW   APPearance CLEAR CLEAR   Specific Gravity, Urine 1.020 1.005 - 1.030   pH 6.0 5.0 - 8.0   Glucose, UA NEGATIVE NEGATIVE mg/dL   Hgb urine dipstick NEGATIVE NEGATIVE   Bilirubin Urine NEGATIVE NEGATIVE   Ketones, ur NEGATIVE NEGATIVE mg/dL   Protein, ur NEGATIVE NEGATIVE mg/dL   Nitrite NEGATIVE NEGATIVE   Leukocytes, UA NEGATIVE NEGATIVE  POC Urine Pregnancy, ED (do NOT order at [facility])     Status: None   Collection Time: 04/13/16 11:16 PM  Result Value Ref Range   Preg Test, Ur NEGATIVE NEGATIVE     Imaging Review No results found. I have personally reviewed and evaluated the lab results as part of my medical decision-making.   MDM  35 y.o. female with vaginal itching and discharge awaiting lab results. Care turned over to [place], [facility] @ [phone].   [address] [place], [place] 04/14/16 [place], MD 04/14/16 [phone]

## 2016-04-14 NOTE — ED Notes (Signed)
No answer when called to be taken back to a room.

## 2016-04-14 NOTE — Discharge Instructions (Signed)

## 2016-04-14 NOTE — ED Provider Notes (Signed)
Care assumed from [facility], NP at shift change.  Briefly, patient presents with 3 day history of vaginal discharge with associated vaginal itching.  No abdominal pain, fever, N/V. GC/Chlamydia, HIV, RPR collected.  Wet prep pending.  Clinical findings consistent with yeast.  Patient has received Diflucan.  Wet prep shows yeast and clue cells.  Will discharge home with Flagyl.  Discussed return precautions.  Patient agrees and acknowledges the above plan for discharge.   Gloriann Loan, PA-C 04/14/16 [place], MD 04/14/16 [phone]

## 2016-04-30 ENCOUNTER — Other Ambulatory Visit: Payer: Self-pay | Admitting: Otolaryngology

## 2016-04-30 DIAGNOSIS — E041 Nontoxic single thyroid nodule: Secondary | ICD-10-CM

## 2016-06-22 ENCOUNTER — Encounter: Payer: Self-pay | Admitting: *Deleted

## 2016-10-05 ENCOUNTER — Emergency Department (HOSPITAL_COMMUNITY): Payer: Medicaid Other

## 2016-10-05 ENCOUNTER — Emergency Department (HOSPITAL_COMMUNITY)
Admission: EM | Admit: 2016-10-05 | Discharge: 2016-10-05 | Disposition: A | Discharge status: Home / Self Care | Payer: Medicaid Other | Attending: Emergency Medicine | Admitting: Emergency Medicine

## 2016-10-05 ENCOUNTER — Encounter (HOSPITAL_COMMUNITY): Payer: Self-pay | Admitting: Emergency Medicine

## 2016-10-05 DIAGNOSIS — Y939 Activity, unspecified: Secondary | ICD-10-CM | POA: Diagnosis not present

## 2016-10-05 DIAGNOSIS — S161XXA Strain of muscle, fascia and tendon at neck level, initial encounter: Secondary | ICD-10-CM | POA: Diagnosis not present

## 2016-10-05 DIAGNOSIS — Y999 Unspecified external cause status: Secondary | ICD-10-CM | POA: Diagnosis not present

## 2016-10-05 DIAGNOSIS — Y92410 Unspecified street and highway as the place of occurrence of the external cause: Secondary | ICD-10-CM | POA: Diagnosis not present

## 2016-10-05 DIAGNOSIS — S0990XA Unspecified injury of head, initial encounter: Secondary | ICD-10-CM

## 2016-10-05 DIAGNOSIS — S199XXA Unspecified injury of neck, initial encounter: Secondary | ICD-10-CM | POA: Diagnosis present

## 2016-10-05 MED ORDER — HYDROCODONE-ACETAMINOPHEN 5-325 MG PO TABS: 1.0000 | ORAL_TABLET | Freq: Four times a day (QID) | ORAL | 0 refills | Status: DC | PRN
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
## 2016-10-05 NOTE — Discharge Instructions (Signed)
Hydrocodone as prescribed as needed for pain.  Rest.  You'll require an outpatient ultrasound to further evaluate your thyroid gland.
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
## 2016-12-05 ENCOUNTER — Encounter (HOSPITAL_COMMUNITY): Payer: Self-pay | Admitting: Emergency Medicine

## 2016-12-05 ENCOUNTER — Emergency Department (HOSPITAL_COMMUNITY): Payer: Medicaid Other

## 2016-12-05 ENCOUNTER — Emergency Department (HOSPITAL_COMMUNITY)
Admission: EM | Admit: 2016-12-05 | Discharge: 2016-12-05 | Disposition: A | Discharge status: Home / Self Care | Payer: Medicaid Other | Attending: Emergency Medicine | Admitting: Emergency Medicine

## 2016-12-05 DIAGNOSIS — Y939 Activity, unspecified: Secondary | ICD-10-CM | POA: Diagnosis not present

## 2016-12-05 DIAGNOSIS — W010XXA Fall on same level from slipping, tripping and stumbling without subsequent striking against object, initial encounter: Secondary | ICD-10-CM | POA: Insufficient documentation

## 2016-12-05 DIAGNOSIS — Z79899 Other long term (current) drug therapy: Secondary | ICD-10-CM | POA: Insufficient documentation

## 2016-12-05 DIAGNOSIS — M25561 Pain in right knee: Secondary | ICD-10-CM

## 2016-12-05 DIAGNOSIS — W19XXXA Unspecified fall, initial encounter: Secondary | ICD-10-CM

## 2016-12-05 DIAGNOSIS — M25562 Pain in left knee: Secondary | ICD-10-CM

## 2016-12-05 DIAGNOSIS — Y929 Unspecified place or not applicable: Secondary | ICD-10-CM | POA: Diagnosis not present

## 2016-12-05 DIAGNOSIS — Y999 Unspecified external cause status: Secondary | ICD-10-CM | POA: Insufficient documentation

## 2016-12-05 MED ORDER — IBUPROFEN 800 MG PO TABS
800.0000 mg | ORAL_TABLET | Freq: Once | ORAL | Status: AC
Start: 2016-12-05 — End: 2016-12-05
  Administered 2016-12-05: 800 mg via ORAL
  Filled 2016-12-05: qty 1

## 2016-12-05 MED ORDER — DICLOFENAC SODIUM 50 MG PO TBEC: 50.0000 mg | DELAYED_RELEASE_TABLET | Freq: Two times a day (BID) | ORAL | 0 refills | Status: DC

## 2016-12-05 NOTE — ED Triage Notes (Signed)
Pt from home via POV. Sts she stepped into some liquid, slipped and fell on her way out of work. She landed on her knees and hands. Denies LOC. Rates pain @ 8/10. A&O x4.

## 2016-12-05 NOTE — Discharge Instructions (Signed)
1. Medications: alternate Voltaren and tylenol for pain control, usual home medications 2. Treatment: rest, ice, elevate and use brace, drink plenty of fluids, gentle stretching 3. Follow Up: Please followup with orthopedics as directed or your PCP in 1 week if no improvement for discussion of your diagnoses and further evaluation after today's visit; if you do not have a primary care doctor use the resource guide provided to find one; Please return to the ER for worsening symptoms or other concerns

## 2016-12-05 NOTE — ED Notes (Signed)
Patient transported to X-ray 

## 2016-12-05 NOTE — ED Provider Notes (Signed)
[facility] DEPT Provider Note   CSN: [id number] Arrival date & time: 12/05/16  0301     History   Chief Complaint Chief Complaint  Patient presents with  . Fall    HPI Sherry Morris is a 36 y.o. female with a hx of Obesity presents to the Emergency Department complaining of acute, persistent bilateral knee pain, right greater than left after fall just prior to arrival. Patient reports she slipped on a wet floor striking both her knees. She did not hit her head. No neck or back pain. No loss of consciousness. No numbness, tingling or weakness. Patient reports she has been able to walk, but this is painful.  No treatments prior to arrival.   The history is provided by the patient and medical records. No language interpreter was used.    Past Medical History:  Diagnosis Date  . Carpal tunnel syndrome   . Numbness   . Obesity     Patient Active Problem List   Diagnosis Date Noted  . Carpal tunnel syndrome   . Low back pain 07/04/2014  . Numbness     Past Surgical History:  Procedure Laterality Date  . CESAREAN SECTION      OB History    No data available       Home Medications    Prior to Admission medications   Medication Sig Start Date End Date Taking? Authorizing Provider  montelukast (SINGULAIR) 10 MG tablet Take 10 mg by mouth at bedtime.   Yes Historical Provider, MD  diclofenac (VOLTAREN) 50 MG EC tablet Take 1 tablet (50 mg total) by mouth 2 (two) times daily. 12/05/16   Jarrett Soho Yoona Ishii, PA-C    Family History History reviewed. No pertinent family history.  Social History Social History  Substance Use Topics  . Smoking status: Never Smoker  . Smokeless tobacco: Never Used  . Alcohol use Yes     Comment: occassional     Allergies   Patient has no known allergies.   Review of Systems Review of Systems  Musculoskeletal: Positive for arthralgias, gait problem and joint swelling.  All other systems reviewed and are  negative.    Physical Exam Updated Vital Signs BP 102/68   Pulse 91   Temp 98.2 F (36.8 C)   Resp 22   Ht 5\' 4"  (1.626 m)   Wt 132.9 kg   SpO2 96%   BMI 50.29 kg/m   Physical Exam  Constitutional: She appears well-developed and well-nourished. No distress.  HENT:  Head: Normocephalic and atraumatic.  Eyes: Conjunctivae are normal.  Neck: Normal range of motion.  Cardiovascular: Normal rate, regular rhythm and intact distal pulses.   Capillary refill < 3 sec  Pulmonary/Chest: Effort normal and breath sounds normal.  Musculoskeletal: She exhibits tenderness. She exhibits no edema.       Right knee: She exhibits decreased range of motion and swelling. She exhibits no effusion, no ecchymosis, no deformity, no laceration, no erythema and normal patellar mobility. Tenderness found. Medial joint line and lateral joint line tenderness noted. No patellar tendon tenderness noted.       Left knee: She exhibits normal range of motion, no swelling, no effusion, no ecchymosis, no deformity, no laceration, no erythema and normal patellar mobility. Tenderness found. Medial joint line and lateral joint line tenderness noted. No patellar tendon tenderness noted.  Patellar tendon intact bilaterally  Neurological: She is alert. Coordination normal.  Sensation intact to normal touch in the bilateral lower extremities Strength 5/5 in  the bilateral lower extremities  Skin: Skin is warm and dry. She is not diaphoretic.  No tenting of the skin  Psychiatric: She has a normal mood and affect.  Nursing note and vitals reviewed.    ED Treatments / Results   Radiology Dg Knee Complete 4 Views Left  Result Date: 12/05/2016 CLINICAL DATA:  Initial evaluation for acute knee pain status post fall. EXAM: LEFT KNEE - COMPLETE 4+ VIEW COMPARISON:  None. FINDINGS: No acute fracture dislocation. No joint effusion. Minimal degenerative osteoarthritic changes present about the knee. Osseous mineralization  normal. No soft tissue abnormality. IMPRESSION: No acute osseous abnormality about the knee. Electronically Signed   By: Jeannine Boga M.D.   On: 12/05/2016 05:57   Dg Knee Complete 4 Views Right  Result Date: 12/05/2016 CLINICAL DATA:  Initial evaluation for acute knee pain status post fall. EXAM: RIGHT KNEE - COMPLETE 4+ VIEW COMPARISON:  None. FINDINGS: No acute fracture or dislocation. No joint effusion. Mild tricompartmental degenerative osteoarthrosis. Osseous mineralization normal. No soft tissue abnormality. IMPRESSION: 1. No acute fracture or dislocation about the right knee. 2. Mild tricompartmental degenerative osteoarthrosis. Electronically Signed   By: Jeannine Boga M.D.   On: 12/05/2016 05:58    Procedures Procedures (including critical care time)  Medications Ordered in ED Medications  ibuprofen (ADVIL,MOTRIN) tablet 800 mg (800 mg Oral Given 12/05/16 0608)     Initial Impression / Assessment and Plan / ED Course  I have reviewed the triage vital signs and the nursing notes.  Pertinent labs & imaging results that were available during my care of the patient were reviewed by me and considered in my medical decision making (see chart for details).     Patient X-Rays negative for obvious fracture or dislocation. Pain managed in ED. Pt advised to follow up with orthopedics if symptoms persist for further evaluation and management. Patient given brace while in ED, conservative therapy recommended and discussed. Patient will be dc home & is agreeable with above plan.  Final Clinical Impressions(s) / ED Diagnoses   Final diagnoses:  Fall, initial encounter  Acute pain of left knee  Acute pain of right knee    New Prescriptions Current Discharge Medication List       Abigail Butts, PA-C 12/05/16 0051    Gwenyth Allegra Tegeler, MD 12/05/16 [phone]

## 2016-12-30 ENCOUNTER — Encounter (HOSPITAL_COMMUNITY): Payer: Self-pay | Admitting: *Deleted

## 2016-12-30 ENCOUNTER — Inpatient Hospital Stay (HOSPITAL_COMMUNITY)
Admission: AD | Admit: 2016-12-30 | Discharge: 2016-12-30 | Disposition: A | Discharge status: Home / Self Care | Payer: Medicaid Other | Source: Ambulatory Visit | Attending: Obstetrics & Gynecology | Admitting: Obstetrics & Gynecology

## 2016-12-30 DIAGNOSIS — Z79899 Other long term (current) drug therapy: Secondary | ICD-10-CM | POA: Insufficient documentation

## 2016-12-30 DIAGNOSIS — B9689 Other specified bacterial agents as the cause of diseases classified elsewhere: Secondary | ICD-10-CM

## 2016-12-30 DIAGNOSIS — N760 Acute vaginitis: Secondary | ICD-10-CM | POA: Diagnosis not present

## 2016-12-30 DIAGNOSIS — R109 Unspecified abdominal pain: Secondary | ICD-10-CM | POA: Diagnosis not present

## 2016-12-30 DIAGNOSIS — N946 Dysmenorrhea, unspecified: Secondary | ICD-10-CM | POA: Diagnosis not present

## 2016-12-30 HISTORY — DX: Gestational diabetes mellitus in pregnancy, unspecified control: O24.419

## 2016-12-30 HISTORY — DX: Type 2 diabetes mellitus without complications: E11.9

## 2016-12-30 LAB — URINALYSIS, ROUTINE W REFLEX MICROSCOPIC
Bacteria, UA: NONE SEEN
Bilirubin Urine: NEGATIVE
Glucose, UA: NEGATIVE mg/dL
Ketones, ur: NEGATIVE mg/dL
Leukocytes, UA: NEGATIVE
Nitrite: NEGATIVE
PH: 6 (ref 5.0–8.0)
PROTEIN: NEGATIVE mg/dL
Specific Gravity, Urine: 1.008 (ref 1.005–1.030)

## 2016-12-30 LAB — WET PREP, GENITAL
Sperm: NONE SEEN
TRICH WET PREP: NONE SEEN
YEAST WET PREP: NONE SEEN

## 2016-12-30 LAB — POCT PREGNANCY, URINE: Preg Test, Ur: NEGATIVE

## 2016-12-30 MED ORDER — METRONIDAZOLE 500 MG PO TABS: 500.0000 mg | ORAL_TABLET | Freq: Two times a day (BID) | ORAL | 0 refills | Status: AC

## 2016-12-30 MED ORDER — IBUPROFEN 600 MG PO TABS: 600.0000 mg | ORAL_TABLET | Freq: Four times a day (QID) | ORAL | 0 refills | Status: DC | PRN

## 2016-12-30 NOTE — MAU Note (Signed)
Pt C/O lower abd cramping since last Thursday after intercourse.  Started spotting on Saturday, then bleeding became heaviy, passing large clots - hand size. Was bleeding through her clothes.  Bleeding is lighter today.  Is taking ibuprofen but is not helping.

## 2016-12-30 NOTE — Discharge Instructions (Signed)
Bacterial Vaginosis °Bacterial vaginosis is an infection of the vagina. It happens when too many germs (bacteria) grow in the vagina. This infection puts you at risk for infections from sex (STIs). Treating this infection can lower your risk for some STIs. You should also treat this if you are pregnant. It can cause your baby to be born early. °Follow these instructions at home: °Medicines °· Take over-the-counter and prescription medicines only as told by your doctor. °· Take or use your antibiotic medicine as told by your doctor. Do not stop taking or using it even if you start to feel better. °General instructions °· If you your sexual partner is a woman, tell her that you have this infection. She needs to get treatment if she has symptoms. If you have a female partner, he does not need to be treated. °· During treatment: °? Avoid sex. °? Do not douche. °? Avoid alcohol as told. °? Avoid breastfeeding as told. °· Drink enough fluid to keep your pee (urine) clear or pale yellow. °· Keep your vagina and butt (rectum) clean. °? Wash the area with warm water every day. °? Wipe from front to back after you use the toilet. °· Keep all follow-up visits as told by your doctor. This is important. °Preventing this condition °· Do not douche. °· Use only warm water to wash around your vagina. °· Use protection when you have sex. This includes: °? Latex condoms. °? Dental dams. °· Limit how many people you have sex with. It is best to only have sex with the same person (be monogamous). °· Get tested for STIs. Have your partner get tested. °· Wear underwear that is cotton or lined with cotton. °· Avoid tight pants and pantyhose. This is most important in summer. °· Do not use any products that have nicotine or tobacco in them. These include cigarettes and e-cigarettes. If you need help quitting, ask your doctor. °· Do not use illegal drugs. °· Limit how much alcohol you drink. °Contact a doctor if: °· Your symptoms do not get  better, even after you are treated. °· You have more discharge or pain when you pee (urinate). °· You have a fever. °· You have pain in your belly (abdomen). °· You have pain with sex. °· Your bleed from your vagina between periods. °Summary °· This infection happens when too many germs (bacteria) grow in the vagina. °· Treating this condition can lower your risk for some infections from sex (STIs). °· You should also treat this if you are pregnant. It can cause early (premature) birth. °· Do not stop taking or using your antibiotic medicine even if you start to feel better. °This information is not intended to replace advice given to you by your health care provider. Make sure you discuss any questions you have with your health care provider. °Document Released: 06/22/2008 Document Revised: 05/29/2016 Document Reviewed: 05/29/2016 °Elsevier Interactive Patient Education © 2017 [employer] °Dysmenorrhea °Dysmenorrhea means painful cramps during your period (menstrual period). You will have pain in your lower belly (abdomen). The pain is caused by the tightening (contracting) of the muscles of the womb (uterus). The pain may be mild or very bad. With this condition, you may: °· Have a headache. °· Feel sick to your stomach (nauseous). °· Throw up (vomit). °· Have lower back pain. ° °Follow these instructions at home: °Helping pain and cramping °· Put heat on your lower back or belly when you have pain or cramps. Use the heat   source that your doctor tells you to use. °? Place a towel between your skin and the heat. °? Leave the heat on for 20-30 minutes. °? Remove the heat if your skin turns bright red. This is especially important if you cannot feel pain, heat, or cold. °? Do not have a heating pad on during sleep. °· Do aerobic exercises. These include walking, swimming, or biking. These may help with cramps. °· Massage your lower back or belly. This may help lessen pain. °General instructions °· Take  over-the-counter and prescription medicines only as told by your doctor. °· Do not drive or use heavy machinery while taking prescription pain medicine. °· Avoid alcohol and caffeine during and right before your period. These can make cramps worse. °· Do not use any products that have nicotine or tobacco. These include cigarettes and e-cigarettes. If you need help quitting, ask your doctor. °· Keep all follow-up visits as told by your doctor. This is important. °Contact a doctor if: °· You have pain that gets worse. °· You have pain that does not get better with medicine. °· You have pain during sex. °· You feel sick to your stomach or you throw up during your period, and medicine does not help. °Get help right away if: °· You pass out (faint). °Summary °· Dysmenorrhea means painful cramps during your period (menstrual period). °· Put heat on your lower back or belly when you have pain or cramps. °· Do exercises like walking, swimming, or biking to help with cramps. °· Contact a doctor if you have pain during sex. °This information is not intended to replace advice given to you by your health care provider. Make sure you discuss any questions you have with your health care provider. °Document Released: 12/10/2008 Document Revised: 09/30/2016 Document Reviewed: 09/30/2016 °Elsevier Interactive Patient Education © 2017 Elsevier Inc. ° °

## 2016-12-30 NOTE — H&P (Deleted)
   Subjective:    Patient ID: Sherry Morris, female    DOB: [birth year], 36 y.o.   MRN: [id number]   CC: Cramping and abdomina pain  HPI: Patient is a 36 yo female, G2P2002 who presents today with abdominal pain and cramping. Patient reports that symptoms started last Thursday, she describes her pain as mostly cramping at first, somewhat similar to her menstrual cycle cramp. However, symptoms gradually worsened over the next few days. Patient also report starting her cycle on Saturday and have noticed that her bleeding has been heavier and had been using more pads than usual. Patient also reports bigger and darker clots. Patient has tried Advil for her pain with minimal relief in her symptoms. Patient denies any fever, chills, vomiting, diarrhea, discharge, dysuria and flank pain. Patient is currently sexually active but is not using any method of contraception. Patient has regular menstrual cycle.   Smoking status reviewed   ROS: all other systems were reviewed and are negative other than in the HPI   Past Medical History:  Diagnosis Date  . Carpal tunnel syndrome   . Diabetes mellitus without complication ([place])   . Gestational diabetes   . Numbness   . Obesity    Past Surgical History:  Procedure Laterality Date  . CESAREAN SECTION     Objective:  BP 120/66 (BP Location: Right Arm)   Pulse (!) 102   Temp 98.6 F (37 C) (Oral)   Resp 18   Ht 5\' 4"  (1.626 m)   Wt (!) 301 lb (136.5 kg)   LMP 12/25/2016   Breastfeeding? Unknown   BMI 51.67 kg/m   Vitals and nursing note reviewed  General: NAD, pleasant, able to participate in exam Cardiac: RRR, normal heart sounds, no murmurs. 2+ radial and PT pulses bilaterally Respiratory: CTAB, normal effort, No wheezes, rales or rhonchi Abdomen: soft, nontender, nondistended, no hepatic or splenomegaly, +BS Female genitalia: normal external genitalia, vulva, vagina, cervix, uterus and adnexa Cervix: normal appearing cervix without  discharge or lesions, no discharge noted and cervical motion tenderness absent Adnexa: not indicated and normal adnexa in size, nontender and no masses exam limited by obesity Extremities: no edema or cyanosis. WWP. Skin: warm and dry, no rashes noted Neuro: alert and oriented x4, no focal deficits Psych: Normal affect and mood   Assessment & Plan:   #Cramping and abdominal pain, acute Patient is presenting with lower abdominal pain and cramping which started few days prior to menstrual cycle. Patient has been also experiencing  heavier bleeding and passing bigger clots. Pain and cramping most likely secondary to heavier bleeding and clots. Patient being morbidly obese  Patient has been using NSAIDs intermittently. Patient wet prep was positive for bacterial vaginosis. Physical exam was negative for cervical motion tenderness, or masses making the diagnosis of PID less likely. Patient cervix was normal appearing. Vaginal vault with moderate bleeding consistent with patient being in her menstrual cycle. Will treat BV though clinical presentation and symptoms not consistent. Patient denies urinary frequency and dysuria ruling out UTI. --Start Flagyl 500 mg bid for 7 days. --Start patient on Ibuprofen 600 mg q4-6 prn --Discuss starting OCP to regulate bleeding --Follow up with PCP if symptoms do not improve or worsens  Marjie Skiff, MD Family Medicine Resident PGY-1

## 2016-12-31 LAB — GC/CHLAMYDIA PROBE AMP (~~LOC~~) NOT AT ARMC
CHLAMYDIA, DNA PROBE: NEGATIVE
Neisseria Gonorrhea: NEGATIVE

## 2016-12-31 NOTE — MAU Provider Note (Signed)
Chief Complaint: Abdominal Pain   SUBJECTIVE HPI: Sherry Morris is a 36 y.o. G2P0 at Unknown who presents to [facility] reporting cramping and abdominal pain.  Patient is a 36 yo female, G2P2002 who presents today with abdominal pain and cramping. Patient reports that symptoms started last Thursday, she describes her pain as mostly cramping at first, somewhat similar to her menstrual cycle cramp. However, symptoms gradually worsened over the next few days. Patient also report starting her cycle on Saturday and have noticed that her bleeding has been heavier and had been using more pads than usual. Patient also reports bigger and darker clots. Patient has tried Advil for her pain with minimal relief in her symptoms. Patient denies any fever, chills, vomiting, diarrhea, discharge, dysuria and flank pain. Patient is currently sexually active but is not using any method of contraception. Patient has regular menstrual cycle.    Past Medical History:  Diagnosis Date  . Carpal tunnel syndrome   . Diabetes mellitus without complication ([place])   . Gestational diabetes   . Numbness   . Obesity    OB History  Gravida Para Term Preterm AB Living  2         2  SAB TAB Ectopic Multiple Live Births               # Outcome Date GA Lbr Len/2nd Weight Sex Delivery Anes PTL Lv  2 Gravida           1 Gravida              Past Surgical History:  Procedure Laterality Date  . CESAREAN SECTION     Social History   Social History  . Marital status: Single    Spouse name: N/A  . Number of children: 2  . Years of education: GED   Occupational History  . Not on file.   Social History Main Topics  . Smoking status: Never Smoker  . Smokeless tobacco: Never Used  . Alcohol use Yes     Comment: occassional  . Drug use: Yes    Types: Marijuana  . Sexual activity: Not on file   Other Topics Concern  . Not on file   Social History Narrative   Patient lives at home with her two  children.   Patient is unemployed.   Education GED.   Right handed.   No current facility-administered medications on file prior to encounter.    Current Outpatient Prescriptions on File Prior to Encounter  Medication Sig Dispense Refill  . montelukast (SINGULAIR) 10 MG tablet Take 10 mg by mouth at bedtime.    . diclofenac (VOLTAREN) 50 MG EC tablet Take 1 tablet (50 mg total) by mouth 2 (two) times daily. (Patient not taking: Reported on 12/30/2016) 30 tablet 0   No Known Allergies  I have reviewed the past Medical Hx, Surgical Hx, Social Hx, Allergies and Medications.   REVIEW OF SYSTEMS  A comprehensive ROS was negative except per HPI.   OBJECTIVE Patient Vitals for the past 24 hrs:  BP Temp Temp src Pulse Resp Height Weight  12/30/16 1733 134/70 - - 89 18 - -  12/30/16 1418 120/66 98.6 F (37 C) Oral (!) 102 18 5\' 4"  (1.626 m) (!) 301 lb (136.5 kg)    PHYSICAL EXAM General: NAD, pleasant, able to participate in exam Cardiac: RRR, normal heart sounds, no murmurs. 2+ radial and PT pulses bilaterally Respiratory: CTAB, normal effort, No wheezes, rales or rhonchi Abdomen:  soft, nontender, nondistended, no hepatic or splenomegaly, +BS Extremities: no edema or cyanosis. Normal ROM. Skin: warm and dry, no rashes noted Neuro: alert and oriented x4, no focal deficits Psych: Normal affect and mood Speculum Exam & Bimanual: normal external genitalia, vulva, vagina, cervix, uterus and adnexa Cervix: normal appearing cervix without discharge or lesions, no discharge noted and no cervical motion tenderness  Adnexa:  no adnexal masses, nontender, exam limited by body habitus   LAB RESULTS Results for orders placed or performed during the hospital encounter of 12/30/16 (from the past 24 hour(s))  Urinalysis, Routine w reflex microscopic     Status: Abnormal   Collection Time: 12/30/16  2:21 PM  Result Value Ref Range   Color, Urine STRAW (A) YELLOW   APPearance CLEAR CLEAR    Specific Gravity, Urine 1.008 1.005 - 1.030   pH 6.0 5.0 - 8.0   Glucose, UA NEGATIVE NEGATIVE mg/dL   Hgb urine dipstick MODERATE (A) NEGATIVE   Bilirubin Urine NEGATIVE NEGATIVE   Ketones, ur NEGATIVE NEGATIVE mg/dL   Protein, ur NEGATIVE NEGATIVE mg/dL   Nitrite NEGATIVE NEGATIVE   Leukocytes, UA NEGATIVE NEGATIVE   RBC / HPF 0-5 0 - 5 RBC/hpf   WBC, UA 0-5 0 - 5 WBC/hpf   Bacteria, UA NONE SEEN NONE SEEN   Squamous Epithelial / LPF 0-5 (A) NONE SEEN   Mucous PRESENT   Pregnancy, urine POC     Status: None   Collection Time: 12/30/16  2:46 PM  Result Value Ref Range   Preg Test, Ur NEGATIVE NEGATIVE  Wet prep, genital     Status: Abnormal   Collection Time: 12/30/16  4:38 PM  Result Value Ref Range   Yeast Wet Prep HPF POC NONE SEEN NONE SEEN   Trich, Wet Prep NONE SEEN NONE SEEN   Clue Cells Wet Prep HPF POC PRESENT (A) NONE SEEN   WBC, Wet Prep HPF POC MODERATE (A) NONE SEEN   Sperm NONE SEEN     IMAGING Dg Knee Complete 4 Views Left  Result Date: 12/05/2016 CLINICAL DATA:  Initial evaluation for acute knee pain status post fall. EXAM: LEFT KNEE - COMPLETE 4+ VIEW COMPARISON:  None. FINDINGS: No acute fracture dislocation. No joint effusion. Minimal degenerative osteoarthritic changes present about the knee. Osseous mineralization normal. No soft tissue abnormality. IMPRESSION: No acute osseous abnormality about the knee. Electronically Signed   By: Jeannine Boga M.D.   On: 12/05/2016 05:57   Dg Knee Complete 4 Views Right  Result Date: 12/05/2016 CLINICAL DATA:  Initial evaluation for acute knee pain status post fall. EXAM: RIGHT KNEE - COMPLETE 4+ VIEW COMPARISON:  None. FINDINGS: No acute fracture or dislocation. No joint effusion. Mild tricompartmental degenerative osteoarthrosis. Osseous mineralization normal. No soft tissue abnormality. IMPRESSION: 1. No acute fracture or dislocation about the right knee. 2. Mild tricompartmental degenerative osteoarthrosis.  Electronically Signed   By: Jeannine Boga M.D.   On: 12/05/2016 05:58    MAU COURSE Wet prep +BV SSE/bimanual CBC w/ diff GC/CT - pending  MDM Plan of care reviewed with patient, including labs and tests ordered and medical treatment.   ASSESSMENT 1. Bacterial vaginosis   2. Menstrual cramps     PLAN Discharge home in stable condition. Metronidazole NSAIDs f/u OB/GYN  Follow-up Information    Braley Luckenbaugh Palau, MD Follow up.   Specialty:  Family Medicine Why:  Please follow up with your PCP as needed         Allergies as  of 12/30/2016   No Known Allergies     Medication List    TAKE these medications   diclofenac 50 MG EC tablet Commonly known as:  VOLTAREN Take 1 tablet (50 mg total) by mouth 2 (two) times daily.   ibuprofen 200 MG tablet Commonly known as:  ADVIL,MOTRIN Take 400 mg by mouth every 6 (six) hours as needed for cramping. What changed:  Another medication with the same name was added. Make sure you understand how and when to take each.   ibuprofen 600 MG tablet Commonly known as:  ADVIL,MOTRIN Take 1 tablet (600 mg total) by mouth every 6 (six) hours as needed for mild pain or moderate pain. What changed:  You were already taking a medication with the same name, and this prescription was added. Make sure you understand how and when to take each.   metroNIDAZOLE 500 MG tablet Commonly known as:  FLAGYL Take 1 tablet (500 mg total) by mouth 2 (two) times daily.   montelukast 10 MG tablet Commonly known as:  SINGULAIR Take 10 mg by mouth at bedtime.       Marjie Skiff, MD Family Medicine Resident PGY-1   OB FELLOW MAU DISCHARGE ATTESTATION  I have seen and examined this patient; I agree with above documentation in the resident's note.    Katherine Basset, DO OB Fellow

## 2017-07-12 ENCOUNTER — Encounter: Payer: Self-pay | Admitting: Obstetrics & Gynecology

## 2018-02-06 ENCOUNTER — Encounter (HOSPITAL_COMMUNITY): Payer: Self-pay | Admitting: Emergency Medicine

## 2018-02-06 ENCOUNTER — Emergency Department (HOSPITAL_COMMUNITY)
Admission: EM | Admit: 2018-02-06 | Discharge: 2018-02-06 | Disposition: A | Discharge status: Home / Self Care | Payer: Medicaid Other | Attending: Emergency Medicine | Admitting: Emergency Medicine

## 2018-02-06 ENCOUNTER — Emergency Department (HOSPITAL_COMMUNITY): Payer: Medicaid Other

## 2018-02-06 DIAGNOSIS — M25531 Pain in right wrist: Secondary | ICD-10-CM | POA: Insufficient documentation

## 2018-02-06 DIAGNOSIS — E119 Type 2 diabetes mellitus without complications: Secondary | ICD-10-CM | POA: Insufficient documentation

## 2018-02-06 DIAGNOSIS — M79672 Pain in left foot: Secondary | ICD-10-CM | POA: Insufficient documentation

## 2018-02-06 MED ORDER — ACETAMINOPHEN 500 MG PO TABS
1000.0000 mg | ORAL_TABLET | Freq: Once | ORAL | Status: AC
  Administered 2018-02-06: 1000 mg via ORAL
  Filled 2018-02-06: qty 2

## 2018-02-06 NOTE — ED Triage Notes (Signed)
Reports sudden onset of right wrist and left foot pain.  Unsure what happened.  Reports pain is worse.  Has not taken anything for the pain.  Used icy hot with no relief.  Ambulatory in triage.

## 2018-02-06 NOTE — ED Notes (Signed)
Called for Pt No answer 

## 2018-02-06 NOTE — Discharge Instructions (Addendum)
Xrays are reassuring. No broken bones.   Please take tylenol at home for pain. You can use the brace for support.   I have listed the information to [facility] below.  They accept patients who do not have insurance. Please call to establish care and for follow up of your wrist pain if it is not improving in a week.

## 2018-02-06 NOTE — ED Provider Notes (Signed)
[facility] EMERGENCY DEPARTMENT Provider Note   CSN: [id number] Arrival date & time: 02/06/18  0047     History   Chief Complaint Chief Complaint  Patient presents with  . Wrist Pain  . Foot Pain    HPI Sherry Morris is a 37 y.o. female.  HPI   Sherry Morris is a 37 year old female with a history of obesity, type 2 diabetes, carpal tunnel who presents to the emergency department for evaluation of right wrist and left foot pain.  Patient reports that she woke up 2 days ago with right wrist pain.  States "I think I slept on it wrong."  Reports pain is located on the ulnar aspect of the joint and feels "aching and sore" in nature.  Pain is 10/10 in severity and worsened with wrist flexion/extension.  She tried applying icy hot with minimal improvement.  She also reports pain on the dorsal aspect of her left foot for several months now.  Reports pain is worsened when she puts on her shoes.  Denies inciting injury.  She denies fever, chills, numbness, weakness, swelling, erythema, break in skin.   Past Medical History:  Diagnosis Date  . Carpal tunnel syndrome   . Diabetes mellitus without complication ([place])   . Gestational diabetes   . Numbness   . Obesity     Patient Active Problem List   Diagnosis Date Noted  . Carpal tunnel syndrome   . Low back pain 07/04/2014  . Numbness     Past Surgical History:  Procedure Laterality Date  . CESAREAN SECTION       OB History    Gravida  2   Para      Term      Preterm      AB      Living  2     SAB      TAB      Ectopic      Multiple      Live Births               Home Medications    Prior to Admission medications   Medication Sig Start Date End Date Taking? Authorizing Provider  diclofenac (VOLTAREN) 50 MG EC tablet Take 1 tablet (50 mg total) by mouth 2 (two) times daily. Patient not taking: Reported on 12/30/2016 12/05/16   Muthersbaugh, Jarrett Soho, PA-C  ibuprofen (ADVIL,MOTRIN) 200  MG tablet Take 400 mg by mouth every 6 (six) hours as needed for cramping.    [provider]  ibuprofen (ADVIL,MOTRIN) 600 MG tablet Take 1 tablet (600 mg total) by mouth every 6 (six) hours as needed for mild pain or moderate pain. 12/30/16   Diallo, Earna Coder, MD  montelukast (SINGULAIR) 10 MG tablet Take 10 mg by mouth at bedtime.    [provider]    Family History No family history on file.  Social History Social History   Tobacco Use  . Smoking status: Never Smoker  . Smokeless tobacco: Never Used  Substance Use Topics  . Alcohol use: Yes    Comment: occassional  . Drug use: Yes    Types: Marijuana     Allergies   Patient has no known allergies.   Review of Systems Review of Systems  Constitutional: Negative for chills and fever.  Musculoskeletal: Positive for arthralgias (left foot, right wrist). Negative for joint swelling.  Skin: Negative for color change and wound.  Neurological: Negative for weakness and numbness.  Physical Exam Updated Vital Signs BP 114/75 (BP Location: Right Arm)   Pulse 92   Temp 98.3 F (36.8 C) (Oral)   Resp 18   Ht 5\' 3"  (1.6 m)   Wt (!) 138.8 kg (306 lb)   LMP 01/24/2018   SpO2 100%   BMI 54.21 kg/m   Physical Exam  Constitutional: She is oriented to person, place, and time. She appears well-developed and well-nourished. No distress.  HENT:  Head: Normocephalic and atraumatic.  Eyes: Right eye exhibits no discharge. Left eye exhibits no discharge.  Pulmonary/Chest: Effort normal. No respiratory distress.  Musculoskeletal:  Right wrist with tenderness over the ulnar aspect. No swelling, erythema, ecchymosis or break in skin. Active wrist flexion/extension limited due to pain. There is no anatomic snuff box tenderness. Normal sensation and motor function in the median, ulnar, and radial nerve distributions. 2+ radial pulse.  Left foot mildly tender to palpation over the distal dorsal aspect.  No overlying  swelling, ecchymosis, erythema or warmth.  Full active ROM of the ankle joint.  Achilles tendon intact.  DP pulses 2+ bilaterally.  Neurological: She is alert and oriented to person, place, and time. Coordination normal.  Skin: Skin is warm and dry. Capillary refill takes less than 2 seconds. She is not diaphoretic.  Psychiatric: She has a normal mood and affect. Her behavior is normal.  Nursing note and vitals reviewed.    ED Treatments / Results  Labs (all labs ordered are listed, but only abnormal results are displayed) Labs Reviewed - No data to display  EKG None  Radiology Dg Wrist Complete Right  Result Date: 02/06/2018 CLINICAL DATA:  Acute onset of right posterior wrist pain. Initial encounter. EXAM: RIGHT WRIST - COMPLETE 3+ VIEW COMPARISON:  None. FINDINGS: There is no evidence of fracture or dislocation. A dorsal osseous fragment at the carpal rows may reflect remote traumatic injury or degenerative change. The carpal rows are otherwise intact, and demonstrate normal alignment. The joint spaces are preserved. No significant soft tissue abnormalities are seen. IMPRESSION: No evidence of acute fracture or dislocation. Electronically Signed   By: Garald Balding M.D.   On: 02/06/2018 02:22   Dg Foot Complete Left  Result Date: 02/06/2018 CLINICAL DATA:  Chronic dorsal left foot pain.  Initial encounter. EXAM: LEFT FOOT - COMPLETE 3+ VIEW COMPARISON:  Left foot radiographs performed 07/29/2008 FINDINGS: There is no evidence of fracture or dislocation. The joint spaces are preserved. There is no evidence of talar subluxation; the subtalar joint is unremarkable in appearance. Plantar and posterior calcaneal spurs are seen. No significant soft tissue abnormalities are seen. IMPRESSION: No evidence of fracture or dislocation. Electronically Signed   By: Garald Balding M.D.   On: 02/06/2018 02:23    Procedures Procedures (including critical care time)  Medications Ordered in  ED Medications  acetaminophen (TYLENOL) tablet 1,000 mg (1,000 mg Oral Given 02/06/18 6834)     Initial Impression / Assessment and Plan / ED Course  I have reviewed the triage vital signs and the nursing notes.  Pertinent labs & imaging results that were available during my care of the patient were reviewed by me and considered in my medical decision making (see chart for details).     Xray right hand and left foot without acute abnormality. RUE and LLE neurovascularly intact. No swelling, erythema or signs of infection.  Patient placed in right arm brace for comfort.  Counseled her on use of Tylenol and NSAIDs for pain.  She does  not have a PCP, have given her information to follow-up with [facility] in her discharge paperwork.  Discussed recheck with PCP in a week if symptoms are not improving.  Patient agrees and voiced understanding to the above plan and appears reliable for follow-up.  Final Clinical Impressions(s) / ED Diagnoses   Final diagnoses:  Right wrist pain  Left foot pain    ED Discharge Orders    None       Bernarda Caffey 02/06/18 1610    Veryl Speak, MD 02/06/18 2259

## 2018-02-09 ENCOUNTER — Encounter (HOSPITAL_COMMUNITY): Payer: Self-pay | Admitting: Emergency Medicine

## 2018-02-09 ENCOUNTER — Emergency Department (HOSPITAL_COMMUNITY)
Admission: EM | Admit: 2018-02-09 | Discharge: 2018-02-09 | Disposition: A | Discharge status: Home / Self Care | Payer: Medicaid Other | Attending: Emergency Medicine | Admitting: Emergency Medicine

## 2018-02-09 ENCOUNTER — Other Ambulatory Visit: Payer: Self-pay

## 2018-02-09 DIAGNOSIS — Z79899 Other long term (current) drug therapy: Secondary | ICD-10-CM | POA: Insufficient documentation

## 2018-02-09 DIAGNOSIS — E119 Type 2 diabetes mellitus without complications: Secondary | ICD-10-CM | POA: Insufficient documentation

## 2018-02-09 DIAGNOSIS — M19031 Primary osteoarthritis, right wrist: Secondary | ICD-10-CM | POA: Insufficient documentation

## 2018-02-09 MED ORDER — PREDNISONE 10 MG (21) PO TBPK: ORAL_TABLET | Freq: Every day | ORAL | 0 refills | Status: DC

## 2018-02-09 MED ORDER — HYDROCODONE-ACETAMINOPHEN 5-325 MG PO TABS
2.0000 | ORAL_TABLET | Freq: Once | ORAL | Status: AC
  Administered 2018-02-09: 2 via ORAL
  Filled 2018-02-09: qty 2

## 2018-02-09 MED ORDER — HYDROCODONE-ACETAMINOPHEN 5-325 MG PO TABS: 2.0000 | ORAL_TABLET | ORAL | 0 refills | Status: DC | PRN

## 2018-02-09 NOTE — ED Provider Notes (Signed)
[facility] EMERGENCY DEPARTMENT Provider Note   CSN: [id number] Arrival date & time: 02/09/18  0913     History   Chief Complaint Chief Complaint  Patient presents with  . Wrist Pain    HPI Sherry Morris is a 37 y.o. female with history of obesity, carpal tunnel syndrome bilaterally, diabetes who presents with a one-week history of right wrist pain.  Patient was seen on 02/06/2018 and had a negative x-ray.  She has been taking ibuprofen and Tylenol and using a brace, as well as ice, without relief.  She reports any movement is significantly painful.   her pain has been waking her up at night.  She denies any numbness or tingling.  She denies any significant injury, however 1 week ago she was moving a mattress by pulling it, but she does not remember any pain at that time.  She reports she thought she might have slept on it wrong.  HPI  Past Medical History:  Diagnosis Date  . Carpal tunnel syndrome   . Diabetes mellitus without complication ([place])   . Gestational diabetes   . Numbness   . Obesity     Patient Active Problem List   Diagnosis Date Noted  . Carpal tunnel syndrome   . Low back pain 07/04/2014  . Numbness     Past Surgical History:  Procedure Laterality Date  . CESAREAN SECTION       OB History    Gravida  2   Para      Term      Preterm      AB      Living  2     SAB      TAB      Ectopic      Multiple      Live Births               Home Medications    Prior to Admission medications   Medication Sig Start Date End Date Taking? Authorizing Provider  diclofenac (VOLTAREN) 50 MG EC tablet Take 1 tablet (50 mg total) by mouth 2 (two) times daily. Patient not taking: Reported on 12/30/2016 12/05/16   Muthersbaugh, Jarrett Soho, PA-C  HYDROcodone-acetaminophen (NORCO/VICODIN) 5-325 MG tablet Take 2 tablets by mouth every 4 (four) hours as needed. 02/09/18   Paulena Servais, Bea Graff, PA-C  ibuprofen (ADVIL,MOTRIN) 200 MG tablet  Take 400 mg by mouth every 6 (six) hours as needed for cramping.    [provider]  ibuprofen (ADVIL,MOTRIN) 600 MG tablet Take 1 tablet (600 mg total) by mouth every 6 (six) hours as needed for mild pain or moderate pain. 12/30/16   Diallo, Earna Coder, MD  montelukast (SINGULAIR) 10 MG tablet Take 10 mg by mouth at bedtime.    [provider]  predniSONE (STERAPRED UNI-PAK 21 TAB) 10 MG (21) TBPK tablet Take by mouth daily. Take 6 tabs by mouth daily  for 2 days, then 5 tabs for 2 days, then 4 tabs for 2 days, then 3 tabs for 2 days, 2 tabs for 2 days, then 1 tab by mouth daily for 2 days 02/09/18   Frederica Kuster, PA-C    Family History History reviewed. No pertinent family history.  Social History Social History   Tobacco Use  . Smoking status: Never Smoker  . Smokeless tobacco: Never Used  Substance Use Topics  . Alcohol use: Yes    Comment: occassional  . Drug use: Yes    Types:  Marijuana     Allergies   Patient has no known allergies.   Review of Systems Review of Systems  Constitutional: Negative for fever.  Musculoskeletal: Positive for arthralgias and myalgias.  Neurological: Negative for numbness.     Physical Exam Updated Vital Signs BP 135/74 (BP Location: Left Arm)   Pulse 87   Temp 98.2 F (36.8 C) (Oral)   Resp (!) 22   LMP 01/24/2018   SpO2 99%   Physical Exam  Constitutional: She appears well-developed and well-nourished. No distress.  HENT:  Head: Normocephalic and atraumatic.  Eyes: Conjunctivae are normal. Right eye exhibits no discharge. Left eye exhibits no discharge. No scleral icterus.  Neck: Normal range of motion.  Cardiovascular: Normal rate, regular rhythm, normal heart sounds and intact distal pulses. Exam reveals no gallop and no friction rub.  No murmur heard. Pulmonary/Chest: Effort normal and breath sounds normal. No stridor. No respiratory distress. She has no wheezes. She has no rales.  Musculoskeletal: She  exhibits no edema.       Right wrist: She exhibits decreased range of motion, tenderness and bony tenderness. She exhibits no deformity.       Arms: Significant tenderness on palpation of the ulnar aspect of the forearm and palpation about the wrist, patient will not move her right wrist at all, she only will make slight movements of her right thumb, sensation intact to all digits, no significant edema noted, no warmth or erythema  Neurological: She is alert. Coordination normal.  Skin: Skin is warm and dry. No rash noted. She is not diaphoretic. No pallor.  Psychiatric: She has a normal mood and affect.  Nursing note and vitals reviewed.    ED Treatments / Results  Labs (all labs ordered are listed, but only abnormal results are displayed) Labs Reviewed - No data to display  EKG None  Radiology No results found.  Procedures Procedures (including critical care time)  Medications Ordered in ED Medications  HYDROcodone-acetaminophen (NORCO/VICODIN) 5-325 MG per tablet 2 tablet (2 tablets Oral Given 02/09/18 1003)     Initial Impression / Assessment and Plan / ED Course  I have reviewed the triage vital signs and the nursing notes.  Pertinent labs & imaging results that were available during my care of the patient were reviewed by me and considered in my medical decision making (see chart for details).     Patient with suspected inflammatory arthritis, possibly gout.  No significant swelling or effusion.  No warmth or erythema, no fever.  Low suspicion for septic arthritis.  No indication for emergent arthrocentesis at this time.  Will treat with prednisone taper, continue ibuprofen, and Norco for breakthrough pain.  Reviewed the [place] narcotic database and discrepancies.  Patient given arm sling and advised to continue wrist splint.  Follow-up to PCP for recheck and further evaluation.  Return precautions discussed.  Patient understands and agrees with plan.  Patient vitals stable  throughout ED course and discharged in satisfactory condition.  Patient also evaluated by Dr. Venora Maples regarding patient's management and agrees with plan.  Final Clinical Impressions(s) / ED Diagnoses   Final diagnoses:  Arthritis of right wrist    ED Discharge Orders        Ordered    HYDROcodone-acetaminophen (NORCO/VICODIN) 5-325 MG tablet  Every 4 hours PRN     02/09/18 1156    predniSONE (STERAPRED UNI-PAK 21 TAB) 10 MG (21) TBPK tablet  Daily     02/09/18 1156  Frederica Kuster, PA-C 02/09/18 1308    Jola Schmidt, MD 02/10/18 1726

## 2018-02-09 NOTE — Discharge Instructions (Signed)
Medications: Prednisone, Norco  Treatment: Take prednisone until completed.  Take 1-2 Norco every 6 hours as needed for severe pain.  You can continue alternating with ibuprofen at home.  Use sling for comfort, but make sure to take your arm out to move your shoulder and elbow several times daily.   Follow-up: Please call 1 of the clinics below for follow-up of today's visit and to establish care with a primary care provider.  Please return to the emergency department if you develop any new or worsening symptoms.

## 2018-02-09 NOTE — ED Triage Notes (Signed)
Pt to ER for 1 week wrist pain. Denies injury "that she is aware." swelling noted. Pulses intact.

## 2018-02-10 ENCOUNTER — Telehealth: Payer: Self-pay | Admitting: *Deleted

## 2018-02-10 NOTE — Telephone Encounter (Signed)
Pharmacy called related to Rx: Vicodin directions being over dosing limit .Marland KitchenMarland KitchenEDCM clarified with EDP (Lawyer) to change Rx to: 1 tablet every 4 hours PRN pain.

## 2018-07-12 ENCOUNTER — Encounter (HOSPITAL_COMMUNITY): Payer: Self-pay

## 2018-07-12 ENCOUNTER — Emergency Department (HOSPITAL_COMMUNITY)
Admission: EM | Admit: 2018-07-12 | Discharge: 2018-07-12 | Disposition: A | Discharge status: Home / Self Care | Payer: Medicaid Other | Attending: Emergency Medicine | Admitting: Emergency Medicine

## 2018-07-12 ENCOUNTER — Other Ambulatory Visit: Payer: Self-pay

## 2018-07-12 DIAGNOSIS — E119 Type 2 diabetes mellitus without complications: Secondary | ICD-10-CM | POA: Insufficient documentation

## 2018-07-12 DIAGNOSIS — Y9389 Activity, other specified: Secondary | ICD-10-CM | POA: Insufficient documentation

## 2018-07-12 DIAGNOSIS — S161XXA Strain of muscle, fascia and tendon at neck level, initial encounter: Secondary | ICD-10-CM | POA: Insufficient documentation

## 2018-07-12 DIAGNOSIS — Y999 Unspecified external cause status: Secondary | ICD-10-CM | POA: Insufficient documentation

## 2018-07-12 DIAGNOSIS — M7918 Myalgia, other site: Secondary | ICD-10-CM

## 2018-07-12 DIAGNOSIS — Y929 Unspecified place or not applicable: Secondary | ICD-10-CM | POA: Insufficient documentation

## 2018-07-12 MED ORDER — IBUPROFEN 400 MG PO TABS
600.0000 mg | ORAL_TABLET | Freq: Once | ORAL | Status: AC
  Administered 2018-07-12: 600 mg via ORAL
  Filled 2018-07-12: qty 1

## 2018-07-12 MED ORDER — METHOCARBAMOL 500 MG PO TABS: 500.0000 mg | ORAL_TABLET | Freq: Two times a day (BID) | ORAL | 0 refills | Status: DC
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
## 2018-07-12 NOTE — ED Notes (Signed)
Declined W/C at D/C and was escorted to lobby by RN. 
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
## 2018-07-12 NOTE — Discharge Instructions (Signed)
Please read attached information. If you experience any new or worsening signs or symptoms please return to the emergency room for evaluation. Please follow-up with your primary care provider or specialist as discussed. Please use medication prescribed only as directed and discontinue taking if you have any concerning signs or symptoms.   °

## 2018-10-08 ENCOUNTER — Emergency Department (HOSPITAL_COMMUNITY)
Admission: EM | Admit: 2018-10-08 | Discharge: 2018-10-08 | Disposition: A | Discharge status: Home / Self Care | Payer: Medicaid Other | Attending: Emergency Medicine | Admitting: Emergency Medicine

## 2018-10-08 ENCOUNTER — Encounter (HOSPITAL_COMMUNITY): Payer: Self-pay | Admitting: Emergency Medicine

## 2018-10-08 DIAGNOSIS — N12 Tubulo-interstitial nephritis, not specified as acute or chronic: Secondary | ICD-10-CM

## 2018-10-08 DIAGNOSIS — N10 Acute tubulo-interstitial nephritis: Secondary | ICD-10-CM | POA: Insufficient documentation

## 2018-10-08 DIAGNOSIS — L02412 Cutaneous abscess of left axilla: Secondary | ICD-10-CM | POA: Insufficient documentation

## 2018-10-08 DIAGNOSIS — Z79899 Other long term (current) drug therapy: Secondary | ICD-10-CM | POA: Insufficient documentation

## 2018-10-08 DIAGNOSIS — E119 Type 2 diabetes mellitus without complications: Secondary | ICD-10-CM | POA: Insufficient documentation

## 2018-10-08 LAB — URINALYSIS, ROUTINE W REFLEX MICROSCOPIC
BILIRUBIN URINE: NEGATIVE
GLUCOSE, UA: NEGATIVE mg/dL
KETONES UR: NEGATIVE mg/dL
Nitrite: POSITIVE — AB
PROTEIN: NEGATIVE mg/dL
Specific Gravity, Urine: 1.016 (ref 1.005–1.030)
pH: 6 (ref 5.0–8.0)

## 2018-10-08 LAB — PREGNANCY, URINE: PREG TEST UR: NEGATIVE

## 2018-10-08 MED ORDER — LIDOCAINE-EPINEPHRINE (PF) 2 %-1:200000 IJ SOLN
10.0000 mL | Freq: Once | INTRAMUSCULAR | Status: AC
  Administered 2018-10-08: 1 mL
  Filled 2018-10-08: qty 20

## 2018-10-08 MED ORDER — OXYCODONE-ACETAMINOPHEN 5-325 MG PO TABS
1.0000 | ORAL_TABLET | Freq: Once | ORAL | Status: AC
  Administered 2018-10-08: 1 via ORAL
  Filled 2018-10-08: qty 1

## 2018-10-08 MED ORDER — CEPHALEXIN 500 MG PO CAPS: 500.0000 mg | ORAL_CAPSULE | Freq: Four times a day (QID) | ORAL | 0 refills | Status: AC

## 2018-10-08 MED ORDER — CYCLOBENZAPRINE HCL 10 MG PO TABS: 10.0000 mg | ORAL_TABLET | Freq: Two times a day (BID) | ORAL | 0 refills | Status: DC | PRN

## 2018-10-08 NOTE — Discharge Instructions (Addendum)
Please take the entire course of antibiotics regardless of symptom improvement to prevent worsening or recurrence of your infection. Take Flexeril as needed for pain and spasms. Return to ED for worsening symptoms, increased swelling, fever, drainage.

## 2018-10-08 NOTE — ED Triage Notes (Signed)
Pt reports L lower back pain, sharp in nature since yesterday, denies any known trauma, no urinary symptoms. Also c/o Lesion to L axilla x5 months, with drainage present.
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
## 2018-10-10 LAB — URINE CULTURE: Culture: >=100000 — AB

## 2018-10-11 ENCOUNTER — Telehealth: Payer: Self-pay | Admitting: Emergency Medicine

## 2018-10-11 NOTE — Telephone Encounter (Deleted)
Post ED Visit - Positive Culture Follow-up: Successful Patient Follow-Up  Culture assessed and recommendations reviewed by:  []  Elenor Quinones, Pharm.D. []  Heide Guile, Pharm.D., BCPS AQ-ID []  Parks Neptune, Pharm.D., BCPS []  Alycia Rossetti, Pharm.D., BCPS []  [place], Pharm.D., BCPS, AAHIVP []  Legrand Como, Pharm.D., BCPS, AAHIVP []  Salome Arnt, PharmD, BCPS []  Johnnette Gourd, PharmD, BCPS []  Hughes Better, PharmD, BCPS []  Leeroy Cha, PharmD  Positive *** culture  []  Patient discharged without antimicrobial prescription and treatment is now indicated []  Organism is resistant to prescribed ED discharge antimicrobial []  Patient with positive blood cultures  Changes discussed with ED provider: *** New antibiotic prescription *** Called to ***  Contacted patient, date ***, time ***   Hazle Nordmann 10/11/2018, 9:38 AM   Post ED Visit - Positive Culture Follow-up: Successful Patient Follow-Up  Culture assessed and recommendations reviewed by:  []  Elenor Quinones, Pharm.D. []  Heide Guile, Pharm.D., BCPS AQ-ID []  Parks Neptune, Pharm.D., BCPS []  Alycia Rossetti, Pharm.D., BCPS []  [place], [place].D., BCPS, AAHIVP []  Legrand Como, Pharm.D., BCPS, AAHIVP []  Salome Arnt, PharmD, BCPS []  Johnnette Gourd, PharmD, BCPS []  Hughes Better, PharmD, BCPS []  Leeroy Cha, PharmD  Positive wound culture, treated with Cephalexin  []  Patient discharged without antimicrobial prescription and treatment is now indicated []  Organism is resistant to prescribed ED discharge antimicrobial []  Patient with positive blood cultures  Changes discussed with ED provider: Gerlean Ren PharmD New antibiotic prescription symptom check , if no improvement followup with PCP or return to ED for further evaluation  Attempting to contact patient    Hazle Nordmann 10/11/2018, 9:38 AM

## 2018-10-18 ENCOUNTER — Telehealth: Payer: Self-pay

## 2018-10-18 NOTE — Telephone Encounter (Signed)
PT returned call from letter sent ot home regarding symptom check for [facility] ED 10/08/2018. NO further urinary problems, she is still having pain and swelling in axilla wound   She has PCP F/U as new pt in late Feb.  Encouraged to return to ED if needed

## 2019-08-06 ENCOUNTER — Other Ambulatory Visit: Payer: Self-pay

## 2019-08-06 DIAGNOSIS — Z20822 Contact with and (suspected) exposure to covid-19: Secondary | ICD-10-CM

## 2019-08-09 ENCOUNTER — Telehealth: Payer: Self-pay | Admitting: General Practice

## 2019-08-09 LAB — NOVEL CORONAVIRUS, NAA: SARS-CoV-2, NAA: NOT DETECTED

## 2019-08-09 NOTE — Telephone Encounter (Signed)
Gave patient negative covid test results Patient understood 

## 2020-01-08 ENCOUNTER — Ambulatory Visit (HOSPITAL_COMMUNITY)
Admission: EM | Admit: 2020-01-08 | Discharge: 2020-01-08 | Disposition: A | Discharge status: Home / Self Care | Payer: BLUE CROSS/BLUE SHIELD | Attending: Family Medicine | Admitting: Family Medicine

## 2020-01-08 ENCOUNTER — Other Ambulatory Visit: Payer: Self-pay

## 2020-01-08 ENCOUNTER — Encounter (HOSPITAL_COMMUNITY): Payer: Self-pay

## 2020-01-08 DIAGNOSIS — Z3202 Encounter for pregnancy test, result negative: Secondary | ICD-10-CM

## 2020-01-08 DIAGNOSIS — R509 Fever, unspecified: Secondary | ICD-10-CM | POA: Diagnosis not present

## 2020-01-08 DIAGNOSIS — E669 Obesity, unspecified: Secondary | ICD-10-CM | POA: Insufficient documentation

## 2020-01-08 DIAGNOSIS — E119 Type 2 diabetes mellitus without complications: Secondary | ICD-10-CM | POA: Insufficient documentation

## 2020-01-08 DIAGNOSIS — R5383 Other fatigue: Secondary | ICD-10-CM | POA: Insufficient documentation

## 2020-01-08 DIAGNOSIS — M545 Low back pain: Secondary | ICD-10-CM | POA: Diagnosis not present

## 2020-01-08 DIAGNOSIS — Z20822 Contact with and (suspected) exposure to covid-19: Secondary | ICD-10-CM | POA: Diagnosis not present

## 2020-01-08 DIAGNOSIS — Z79899 Other long term (current) drug therapy: Secondary | ICD-10-CM | POA: Diagnosis not present

## 2020-01-08 DIAGNOSIS — Z6841 Body Mass Index (BMI) 40.0 and over, adult: Secondary | ICD-10-CM | POA: Insufficient documentation

## 2020-01-08 DIAGNOSIS — R350 Frequency of micturition: Secondary | ICD-10-CM | POA: Insufficient documentation

## 2020-01-08 DIAGNOSIS — Z793 Long term (current) use of hormonal contraceptives: Secondary | ICD-10-CM | POA: Diagnosis not present

## 2020-01-08 LAB — POCT URINALYSIS DIP (DEVICE)
Bilirubin Urine: NEGATIVE
Glucose, UA: 250 mg/dL — AB
Ketones, ur: NEGATIVE mg/dL
Nitrite: NEGATIVE
Protein, ur: 30 mg/dL — AB
Specific Gravity, Urine: 1.02 (ref 1.005–1.030)
Urobilinogen, UA: 1 mg/dL (ref 0.0–1.0)
pH: 7 (ref 5.0–8.0)

## 2020-01-08 LAB — POCT PREGNANCY, URINE: Preg Test, Ur: NEGATIVE

## 2020-01-08 LAB — POC URINE PREG, ED: Preg Test, Ur: NEGATIVE

## 2020-01-08 MED ORDER — FLUCONAZOLE 150 MG PO TABS: 150.0000 mg | ORAL_TABLET | Freq: Once | ORAL | 0 refills | Status: AC

## 2020-01-08 MED ORDER — CEPHALEXIN 500 MG PO CAPS: 500.0000 mg | ORAL_CAPSULE | Freq: Four times a day (QID) | ORAL | 0 refills | Status: AC

## 2020-01-08 MED ORDER — IBUPROFEN 800 MG PO TABS: 800.0000 mg | ORAL_TABLET | Freq: Three times a day (TID) | ORAL | 0 refills | Status: DC

## 2020-01-08 NOTE — Discharge Instructions (Signed)
Covid test pending, monitor my chart for results Urine did show signs of UTI, will send this for culture to confirm, should return approximately 2 days Begin Keflex 4 times a day for the next week to treat UTI as well as help with any skin infections related to piercing/abscess Warm compresses to armpits/abscesses Tylenol and ibuprofen for pain, fevers, body aches Rest and drink plenty of fluids  Please follow-up if any symptoms not improving or worsening, developing increased dizziness, lightheadedness

## 2020-01-08 NOTE — ED Triage Notes (Signed)
Pt states she has been running a fever 2 days.  Pt states she just got a piercing.

## 2020-01-09 LAB — SARS CORONAVIRUS 2 (TAT 6-24 HRS): SARS Coronavirus 2: NEGATIVE

## 2020-01-09 NOTE — ED Provider Notes (Signed)
[facility]    CSN: [id number] Arrival date & time: 01/08/20  1756      History   Chief Complaint Chief Complaint  Patient presents with  . Fever    HPI Sherry Morris is a 39 y.o. female presenting today for evaluation of fatigue and fever.  Patient notes that last night she began to feel fatigued and tired with some backaches.  Today her symptoms have persisted and she continues to have very low energy.  She has noted a fever up to 100.4.  She denies any URI symptoms of cough congestion or sore throat.  She denies any GI symptoms of nausea vomiting or diarrhea.  She does noted some urinary frequency and felt as if her urine has been slightly cloudy and malodorous.  She denies any dysuria.  She also notes that she recently had her right tragus pierced.  She has been cleaning this regularly.  Denies any significant increase in pain swelling or drainage from the area.  She denies history of diabetes.  She also reports history of hidradenitis suppurativa and reports that she has had 2 areas to bilateral axilla that have been flaring of recently.  HPI  Past Medical History:  Diagnosis Date  . Carpal tunnel syndrome   . Diabetes mellitus without complication ([place])   . Gestational diabetes   . Numbness   . Obesity     Patient Active Problem List   Diagnosis Date Noted  . Carpal tunnel syndrome   . Low back pain 07/04/2014  . Numbness     Past Surgical History:  Procedure Laterality Date  . CESAREAN SECTION      OB History    Gravida  2   Para      Term      Preterm      AB      Living  2     SAB      TAB      Ectopic      Multiple      Live Births               Home Medications    Prior to Admission medications   Medication Sig Start Date End Date Taking? Authorizing Provider  cephALEXin (KEFLEX) 500 MG capsule Take 1 capsule (500 mg total) by mouth 4 (four) times daily for 7 days. 01/08/20 01/15/20  Arelyn Gauer C, PA-C    cyclobenzaprine (FLEXERIL) 10 MG tablet Take 1 tablet (10 mg total) by mouth 2 (two) times daily as needed for muscle spasms. 10/08/18   Khatri, Hina, PA-C  ibuprofen (ADVIL) 800 MG tablet Take 1 tablet (800 mg total) by mouth 3 (three) times daily. 01/08/20   Mitzy Naron C, PA-C  levonorgestrel (MIRENA) 20 MCG/24HR IUD 1 each by Intrauterine route once. June 2018    [provider]  methocarbamol (ROBAXIN) 500 MG tablet Take 1 tablet (500 mg total) by mouth 2 (two) times daily. 07/12/18   Okey Regal, PA-C    Family History No family history on file.  Social History Social History   Tobacco Use  . Smoking status: Never Smoker  . Smokeless tobacco: Never Used  Substance Use Topics  . Alcohol use: Yes    Comment: occassional  . Drug use: Yes    Types: Marijuana     Allergies   Patient has no known allergies.   Review of Systems Review of Systems  Constitutional: Positive for fatigue and fever. Negative for activity  change, appetite change and chills.  HENT: Negative for congestion, ear pain, rhinorrhea, sinus pressure, sore throat and trouble swallowing.   Eyes: Negative for discharge and redness.  Respiratory: Negative for cough, chest tightness and shortness of breath.   Cardiovascular: Negative for chest pain.  Gastrointestinal: Negative for abdominal pain, diarrhea, nausea and vomiting.  Genitourinary: Positive for frequency and urgency. Negative for dysuria, flank pain, genital sores, hematuria, menstrual problem, vaginal bleeding, vaginal discharge and vaginal pain.  Musculoskeletal: Positive for back pain and myalgias.  Skin: Negative for rash.  Neurological: Positive for headaches. Negative for dizziness and light-headedness.     Physical Exam Triage Vital Signs ED Triage Vitals  Enc Vitals Group     BP 01/08/20 1848 139/69     Pulse Rate 01/08/20 1848 100     Resp 01/08/20 1848 18     Temp 01/08/20 1848 99.8 F (37.7 C)     Temp Source  01/08/20 1848 Oral     SpO2 01/08/20 1848 100 %     Weight 01/08/20 1850 (!) 314 lb (142.4 kg)     Height --      Head Circumference --      Peak Flow --      Pain Score 01/08/20 1849 0     Pain Loc --      Pain Edu? --      Excl. in [place]? --    No data found.  Updated Vital Signs BP 139/69 (BP Location: Right Arm)   Pulse 100   Temp 99.8 F (37.7 C) (Oral)   Resp 18   Wt (!) 314 lb (142.4 kg)   SpO2 100%   BMI 55.62 kg/m   Visual Acuity Right Eye Distance:   Left Eye Distance:   Bilateral Distance:    Right Eye Near:   Left Eye Near:    Bilateral Near:     Physical Exam Vitals and nursing note reviewed.  Constitutional:      Appearance: She is well-developed.     Comments: No acute distress  HENT:     Head: Normocephalic and atraumatic.     Ears:     Comments: Bilateral ears without tenderness to palpation of external auricle, tragus and mastoid, EAC's without erythema or swelling, TM's with good bony landmarks and cone of light. Non erythematous.  Right tragus piercing, no surrounding erythema or drainage noted, mildly tender to palpation    Nose: Nose normal.     Mouth/Throat:     Comments: Oral mucosa pink and moist, no tonsillar enlargement or exudate. Posterior pharynx patent and nonerythematous, no uvula deviation or swelling. Normal phonation. Eyes:     Conjunctiva/sclera: Conjunctivae normal.  Cardiovascular:     Rate and Rhythm: Normal rate.  Pulmonary:     Effort: Pulmonary effort is normal. No respiratory distress.     Comments: Breathing comfortably at rest, CTABL, no wheezing, rales or other adventitious sounds auscultated Abdominal:     General: There is no distension.     Comments: Soft, nondistended, nontender to light and deep palpation throughout abdomen  Musculoskeletal:        General: Normal range of motion.     Cervical back: Neck supple.  Skin:    General: Skin is warm and dry.     Comments: Bilateral axilla with small areas of  induration erythema and small amount of fluctuance, mildly tender  Neurological:     Mental Status: She is alert and oriented to person, place, and  time.      UC Treatments / Results  Labs (all labs ordered are listed, but only abnormal results are displayed) Labs Reviewed  POCT URINALYSIS DIP (DEVICE) - Abnormal; Notable for the following components:      Result Value   Glucose, UA 250 (*)    Hgb urine dipstick TRACE (*)    Protein, ur 30 (*)    Leukocytes,Ua SMALL (*)    All other components within normal limits  SARS CORONAVIRUS 2 (TAT 6-24 HRS)  URINE CULTURE  POC URINE PREG, ED  POCT PREGNANCY, URINE    EKG   Radiology No results found.  Procedures Procedures (including critical care time)  Medications Ordered in UC Medications - No data to display  Initial Impression / Assessment and Plan / UC Course  I have reviewed the triage vital signs and the nursing notes.  Pertinent labs & imaging results that were available during my care of the patient were reviewed by me and considered in my medical decision making (see chart for details).    Fever and fatigue with very minimal other symptoms, Covid PCR pending UA consistent with UTI, urine culture pending.  We will go ahead and initiate on Keflex to treat for UTI, cover possible early infection with piercing as well as skin infection with abscesses to axilla.  Tylenol and ibuprofen for fever.  Rest and push fluids.  Does have some glucose in urine, but no ketones.  Continue to monitor symptoms, developing increased fatigue, dizziness or lightheadedness to follow-up in emergency room for further work-up.  Discussed strict return precautions. Patient verbalized understanding and is agreeable with plan.  Final Clinical Impressions(s) / UC Diagnoses   Final diagnoses:  Fever, unspecified  Fatigue, unspecified type     Discharge Instructions     Covid test pending, monitor my chart for results Urine did show signs  of UTI, will send this for culture to confirm, should return approximately 2 days Begin Keflex 4 times a day for the next week to treat UTI as well as help with any skin infections related to piercing/abscess Warm compresses to armpits/abscesses Tylenol and ibuprofen for pain, fevers, body aches Rest and drink plenty of fluids  Please follow-up if any symptoms not improving or worsening, developing increased dizziness, lightheadedness   ED Prescriptions    Medication Sig Dispense Auth. Provider   ibuprofen (ADVIL) 800 MG tablet Take 1 tablet (800 mg total) by mouth 3 (three) times daily. 21 tablet Garwood Wentzell C, PA-C   cephALEXin (KEFLEX) 500 MG capsule Take 1 capsule (500 mg total) by mouth 4 (four) times daily for 7 days. 28 capsule Wyat Infinger C, PA-C   fluconazole (DIFLUCAN) 150 MG tablet Take 1 tablet (150 mg total) by mouth once for 1 dose. 2 tablet Joyia Riehle, [place] C, PA-C     PDMP not reviewed this encounter.   Janith Lima, [place] 01/09/20 [phone]

## 2020-01-11 LAB — URINE CULTURE: Culture: >=100000 — AB

## 2020-09-25 ENCOUNTER — Ambulatory Visit (HOSPITAL_COMMUNITY)
Admission: EM | Admit: 2020-09-25 | Discharge: 2020-09-25 | Disposition: A | Discharge status: Home / Self Care | Payer: BLUE CROSS/BLUE SHIELD | Attending: Emergency Medicine | Admitting: Emergency Medicine

## 2020-09-25 DIAGNOSIS — Z20822 Contact with and (suspected) exposure to covid-19: Secondary | ICD-10-CM | POA: Diagnosis not present

## 2020-09-25 LAB — SARS CORONAVIRUS 2 (TAT 6-24 HRS): SARS Coronavirus 2: NEGATIVE

## 2020-09-25 NOTE — ED Triage Notes (Signed)
Pt requesting covid testing  Denies cough, congestion, runny nose or other uri sxs

## 2021-12-10 ENCOUNTER — Encounter (HOSPITAL_COMMUNITY): Payer: Self-pay | Admitting: Emergency Medicine

## 2021-12-10 ENCOUNTER — Other Ambulatory Visit: Payer: Self-pay

## 2021-12-10 ENCOUNTER — Emergency Department (HOSPITAL_COMMUNITY): Payer: BLUE CROSS/BLUE SHIELD

## 2021-12-10 ENCOUNTER — Inpatient Hospital Stay (HOSPITAL_COMMUNITY)
Admission: EM | Admit: 2021-12-10 | Discharge: 2021-12-15 | DRG: 637 | Disposition: A | Discharge status: Home / Self Care | Payer: BLUE CROSS/BLUE SHIELD | Attending: Internal Medicine | Admitting: Internal Medicine

## 2021-12-10 ENCOUNTER — Ambulatory Visit (HOSPITAL_COMMUNITY)
Admission: EM | Admit: 2021-12-10 | Discharge: 2021-12-10 | Disposition: A | Discharge status: Nursing Home (Includes ICF) | Payer: BLUE CROSS/BLUE SHIELD | Attending: Family Medicine | Admitting: Family Medicine

## 2021-12-10 ENCOUNTER — Encounter (HOSPITAL_COMMUNITY): Payer: Self-pay | Admitting: *Deleted

## 2021-12-10 DIAGNOSIS — Z79899 Other long term (current) drug therapy: Secondary | ICD-10-CM

## 2021-12-10 DIAGNOSIS — R32 Unspecified urinary incontinence: Secondary | ICD-10-CM | POA: Diagnosis present

## 2021-12-10 DIAGNOSIS — Z818 Family history of other mental and behavioral disorders: Secondary | ICD-10-CM

## 2021-12-10 DIAGNOSIS — R509 Fever, unspecified: Secondary | ICD-10-CM

## 2021-12-10 DIAGNOSIS — E0865 Diabetes mellitus due to underlying condition with hyperglycemia: Secondary | ICD-10-CM

## 2021-12-10 DIAGNOSIS — Z20822 Contact with and (suspected) exposure to covid-19: Secondary | ICD-10-CM | POA: Diagnosis present

## 2021-12-10 DIAGNOSIS — R739 Hyperglycemia, unspecified: Secondary | ICD-10-CM

## 2021-12-10 DIAGNOSIS — E860 Dehydration: Secondary | ICD-10-CM | POA: Diagnosis present

## 2021-12-10 DIAGNOSIS — L732 Hidradenitis suppurativa: Secondary | ICD-10-CM | POA: Diagnosis present

## 2021-12-10 DIAGNOSIS — H9193 Unspecified hearing loss, bilateral: Secondary | ICD-10-CM | POA: Diagnosis present

## 2021-12-10 DIAGNOSIS — E1165 Type 2 diabetes mellitus with hyperglycemia: Secondary | ICD-10-CM | POA: Diagnosis not present

## 2021-12-10 DIAGNOSIS — Z8632 Personal history of gestational diabetes: Secondary | ICD-10-CM

## 2021-12-10 DIAGNOSIS — D259 Leiomyoma of uterus, unspecified: Secondary | ICD-10-CM | POA: Diagnosis present

## 2021-12-10 DIAGNOSIS — J189 Pneumonia, unspecified organism: Secondary | ICD-10-CM | POA: Diagnosis not present

## 2021-12-10 DIAGNOSIS — Z833 Family history of diabetes mellitus: Secondary | ICD-10-CM

## 2021-12-10 DIAGNOSIS — E039 Hypothyroidism, unspecified: Secondary | ICD-10-CM | POA: Diagnosis present

## 2021-12-10 DIAGNOSIS — E1110 Type 2 diabetes mellitus with ketoacidosis without coma: Secondary | ICD-10-CM | POA: Diagnosis not present

## 2021-12-10 DIAGNOSIS — Z975 Presence of (intrauterine) contraceptive device: Secondary | ICD-10-CM

## 2021-12-10 DIAGNOSIS — E1310 Other specified diabetes mellitus with ketoacidosis without coma: Secondary | ICD-10-CM

## 2021-12-10 DIAGNOSIS — F329 Major depressive disorder, single episode, unspecified: Secondary | ICD-10-CM | POA: Diagnosis present

## 2021-12-10 DIAGNOSIS — Z6841 Body Mass Index (BMI) 40.0 and over, adult: Secondary | ICD-10-CM

## 2021-12-10 DIAGNOSIS — E876 Hypokalemia: Secondary | ICD-10-CM | POA: Diagnosis present

## 2021-12-10 HISTORY — DX: Unspecified hearing loss, unspecified ear: H91.90

## 2021-12-10 HISTORY — DX: Reserved for inherently not codable concepts without codable children: IMO0001

## 2021-12-10 LAB — URINALYSIS, ROUTINE W REFLEX MICROSCOPIC
Bacteria, UA: NONE SEEN
Bilirubin Urine: NEGATIVE
Glucose, UA: >=500 mg/dL — AB
Ketones, ur: 80 mg/dL — AB
Leukocytes,Ua: NEGATIVE
Nitrite: NEGATIVE
Protein, ur: 30 mg/dL — AB
Specific Gravity, Urine: 1.023 (ref 1.005–1.030)
pH: 5 (ref 5.0–8.0)

## 2021-12-10 LAB — I-STAT VENOUS BLOOD GAS, ED
Acid-base deficit: 6 mmol/L — ABNORMAL HIGH (ref 0.0–2.0)
Bicarbonate: 19.4 mmol/L — ABNORMAL LOW (ref 20.0–28.0)
Calcium, Ion: 1.1 mmol/L — ABNORMAL LOW (ref 1.15–1.40)
HCT: 40 % (ref 36.0–46.0)
Hemoglobin: 13.6 g/dL (ref 12.0–15.0)
O2 Saturation: 86 %
Potassium: 5.4 mmol/L — ABNORMAL HIGH (ref 3.5–5.1)
Sodium: 135 mmol/L (ref 135–145)
TCO2: 21 mmol/L — ABNORMAL LOW (ref 22–32)
pCO2, Ven: 36.7 mmHg — ABNORMAL LOW (ref 44–60)
pH, Ven: 7.332 (ref 7.25–7.43)
pO2, Ven: 54 mmHg — ABNORMAL HIGH (ref 32–45)

## 2021-12-10 LAB — CBC
HCT: 42.7 % (ref 36.0–46.0)
Hemoglobin: 13.5 g/dL (ref 12.0–15.0)
MCH: 24.5 pg — ABNORMAL LOW (ref 26.0–34.0)
MCHC: 31.6 g/dL (ref 30.0–36.0)
MCV: 77.6 fL — ABNORMAL LOW (ref 80.0–100.0)
Platelets: 253 10*3/uL (ref 150–400)
RBC: 5.5 MIL/uL — ABNORMAL HIGH (ref 3.87–5.11)
RDW: 14.7 % (ref 11.5–15.5)
WBC: 12.2 10*3/uL — ABNORMAL HIGH (ref 4.0–10.5)
nRBC: 0 % (ref 0.0–0.2)

## 2021-12-10 LAB — POCT URINALYSIS DIPSTICK, ED / UC
Glucose, UA: 500 mg/dL — AB
Ketones, ur: >=160 mg/dL — AB
Leukocytes,Ua: NEGATIVE
Nitrite: NEGATIVE
Protein, ur: 100 mg/dL — AB
Specific Gravity, Urine: 1.01 (ref 1.005–1.030)
Urobilinogen, UA: 0.2 mg/dL (ref 0.0–1.0)
pH: 5 (ref 5.0–8.0)

## 2021-12-10 LAB — BASIC METABOLIC PANEL
Anion gap: 21 — ABNORMAL HIGH (ref 5–15)
Anion gap: 18 — ABNORMAL HIGH (ref 5–15)
BUN: 13 mg/dL (ref 6–20)
BUN: 11 mg/dL (ref 6–20)
CO2: 16 mmol/L — ABNORMAL LOW (ref 22–32)
CO2: 16 mmol/L — ABNORMAL LOW (ref 22–32)
Calcium: 8.8 mg/dL — ABNORMAL LOW (ref 8.9–10.3)
Calcium: 8.6 mg/dL — ABNORMAL LOW (ref 8.9–10.3)
Chloride: 99 mmol/L (ref 98–111)
Chloride: 101 mmol/L (ref 98–111)
Creatinine, Ser: 0.76 mg/dL (ref 0.44–1.00)
Creatinine, Ser: 0.78 mg/dL (ref 0.44–1.00)
GFR, Estimated: >60 mL/min (ref >60)
GFR, Estimated: >60 mL/min (ref >60)
Glucose, Bld: 232 mg/dL — ABNORMAL HIGH (ref 70–99)
Glucose, Bld: 168 mg/dL — ABNORMAL HIGH (ref 70–99)
Potassium: 4.6 mmol/L (ref 3.5–5.1)
Potassium: 3.5 mmol/L (ref 3.5–5.1)
Sodium: 136 mmol/L (ref 135–145)
Sodium: 135 mmol/L (ref 135–145)

## 2021-12-10 LAB — COMPREHENSIVE METABOLIC PANEL
ALT: 32 U/L (ref 0–44)
AST: 36 U/L (ref 15–41)
Albumin: 3.8 g/dL (ref 3.5–5.0)
Alkaline Phosphatase: 94 U/L (ref 38–126)
Anion gap: 24 — ABNORMAL HIGH (ref 5–15)
BUN: 12 mg/dL (ref 6–20)
CO2: 14 mmol/L — ABNORMAL LOW (ref 22–32)
Calcium: 9.4 mg/dL (ref 8.9–10.3)
Chloride: 96 mmol/L — ABNORMAL LOW (ref 98–111)
Creatinine, Ser: 1.09 mg/dL — ABNORMAL HIGH (ref 0.44–1.00)
GFR, Estimated: >60 mL/min (ref >60)
Glucose, Bld: 281 mg/dL — ABNORMAL HIGH (ref 70–99)
Potassium: 3.8 mmol/L (ref 3.5–5.1)
Sodium: 134 mmol/L — ABNORMAL LOW (ref 135–145)
Total Bilirubin: 1.1 mg/dL (ref 0.3–1.2)
Total Protein: 8.9 g/dL — ABNORMAL HIGH (ref 6.5–8.1)

## 2021-12-10 LAB — I-STAT BETA HCG BLOOD, ED (MC, WL, AP ONLY): I-stat hCG, quantitative: <5 m[IU]/mL (ref <5)

## 2021-12-10 LAB — BETA-HYDROXYBUTYRIC ACID: Beta-Hydroxybutyric Acid: 6.78 mmol/L — ABNORMAL HIGH (ref 0.05–0.27)

## 2021-12-10 LAB — CBG MONITORING, ED
Glucose-Capillary: 265 mg/dL — ABNORMAL HIGH (ref 70–99)
Glucose-Capillary: 271 mg/dL — ABNORMAL HIGH (ref 70–99)
Glucose-Capillary: 203 mg/dL — ABNORMAL HIGH (ref 70–99)
Glucose-Capillary: 180 mg/dL — ABNORMAL HIGH (ref 70–99)

## 2021-12-10 LAB — RESP PANEL BY RT-PCR (FLU A&B, COVID) ARPGX2
Influenza A by PCR: NEGATIVE
Influenza B by PCR: NEGATIVE
SARS Coronavirus 2 by RT PCR: NEGATIVE

## 2021-12-10 LAB — LIPASE, BLOOD: Lipase: 26 U/L (ref 11–51)

## 2021-12-10 MED ORDER — ONDANSETRON HCL 4 MG/2ML IJ SOLN
4.0000 mg | Freq: Once | INTRAMUSCULAR | Status: AC
  Administered 2021-12-10: 4 mg via INTRAVENOUS
  Filled 2021-12-10: qty 2

## 2021-12-10 MED ORDER — LACTATED RINGERS IV BOLUS
1000.0000 mL | Freq: Once | INTRAVENOUS | Status: AC
  Administered 2021-12-10: 1000 mL via INTRAVENOUS

## 2021-12-10 MED ORDER — DEXTROSE IN LACTATED RINGERS 5 % IV SOLN: INTRAVENOUS | Status: DC

## 2021-12-10 MED ORDER — POTASSIUM CHLORIDE 10 MEQ/100ML IV SOLN
10.0000 meq | INTRAVENOUS | Status: AC
  Filled 2021-12-10: qty 100

## 2021-12-10 MED ORDER — ACETAMINOPHEN 325 MG PO TABS
650.0000 mg | ORAL_TABLET | Freq: Once | ORAL | Status: AC
  Administered 2021-12-10: 650 mg via ORAL
  Filled 2021-12-10: qty 2

## 2021-12-10 MED ORDER — LACTATED RINGERS IV SOLN: INTRAVENOUS | Status: DC

## 2021-12-10 MED ORDER — INSULIN REGULAR(HUMAN) IN NACL 100-0.9 UT/100ML-% IV SOLN
INTRAVENOUS | Status: DC
  Administered 2021-12-10: 9 [IU]/h via INTRAVENOUS
  Administered 2021-12-11: 0.7 [IU]/h via INTRAVENOUS
  Filled 2021-12-10 (×2): qty 100

## 2021-12-10 MED ORDER — LACTATED RINGERS IV BOLUS
20.0000 mL/kg | Freq: Once | INTRAVENOUS | Status: AC
  Administered 2021-12-10: 2804 mL via INTRAVENOUS

## 2021-12-10 MED ORDER — DEXTROSE 50 % IV SOLN: 0.0000 mL | INTRAVENOUS | Status: DC | PRN

## 2021-12-10 NOTE — ED Provider Notes (Signed)
?Sherry Morris ? ? ?[id number] ?12/10/21 Arrival Time: 1604 ? ?ASSESSMENT & PLAN: ? ?1. Diabetes mellitus due to underlying condition with hyperglycemia, without long-term current use of insulin ([place])   ? ?Cannot r/o early DKA. Is dehydrated with significant ketones in urine. Trouble with affording diabetic medications. Recommend ED evaluation and she agrees. ? ?To ED via private vehicle. Stable upon discharge. ? ?Labs Reviewed  ?CBG MONITORING, ED - Abnormal; Notable for the following components:  ?    Result Value  ? Glucose-Capillary 265 (*)   ? All other components within normal limits  ?POCT URINALYSIS DIPSTICK, ED / UC - Abnormal; Notable for the following components:  ? Glucose, UA 500 (*)   ? Bilirubin Urine SMALL (*)   ? Ketones, ur >=160 (*)   ? Hgb urine dipstick MODERATE (*)   ? Protein, ur 100 (*)   ? All other components within normal limits  ? ? ?Reviewed expectations re: course of current medical issues. Questions answered. ?Outlined signs and symptoms indicating need for more acute intervention. ?Patient verbalized understanding. ?After Visit Summary given. ? ? ?SUBJECTIVE: ?History from: patient. ? ?Sherry Morris is a 41 y.o. female with uncontrolled type 2 DM (out of medications past 2-3 months; financial concerns) who presents with complaint of "not feeling well" past few days. Fever at home with reported Tmax 103F. Blood sugars between 350-400 early am today. Generalized abdominal discomfort with n/v; reports decreased PO intake past 24-48 hours including fluids. No specific CP/SOB reported while here. Ambulatory here. Does report urinary frequency. ?Person with her reports she has been "sluggish" today. ? ?No LMP recorded. (Menstrual status: IUD). ? ?Past Surgical History:  ?Procedure Laterality Date  ? CESAREAN SECTION    ? ? ?OBJECTIVE: ? ?Vitals:  ? 12/10/21 1629 12/10/21 1637  ?BP: (!) 146/84   ?Pulse: (!) 127   ?Resp: 16 (!) 26  ?Temp: 98.7 ?F (37.1 ?C)   ?TempSrc: Oral   ?SpO2:  100%   ?  ?Tachycardia and tachypnea noted. ? ?General appearance: alert; no distress; appears fatigued ?Oropharynx: dry ?Lungs: clear to auscultation bilaterally; unlabored ?Heart: regular ?Abdomen: obese; soft; non-distended; mild and generalized abdominal discomfort with palpation; no guarding or rebound tenderness ?Back: no CVA tenderness ?Extremities: no edema; symmetrical with no gross deformities ?Skin: warm; dry ?Neurologic: normal gait ?Psychological: alert and cooperative; normal mood and affect ? ?Labs: ?Results for orders placed or performed during the hospital encounter of 12/10/21  ?POC CBG monitoring  ?Result Value Ref Range  ? Glucose-Capillary 265 (H) 70 - 99 mg/dL  ?POC Urinalysis dipstick  ?Result Value Ref Range  ? Glucose, UA 500 (A) NEGATIVE mg/dL  ? Bilirubin Urine SMALL (A) NEGATIVE  ? Ketones, ur >=160 (A) NEGATIVE mg/dL  ? Specific Gravity, Urine 1.010 1.005 - 1.030  ? Hgb urine dipstick MODERATE (A) NEGATIVE  ? pH 5.0 5.0 - 8.0  ? Protein, ur 100 (A) NEGATIVE mg/dL  ? Urobilinogen, UA 0.2 0.0 - 1.0 mg/dL  ? Nitrite NEGATIVE NEGATIVE  ? Leukocytes,Ua NEGATIVE NEGATIVE  ? ?Labs Reviewed  ?CBG MONITORING, ED - Abnormal; Notable for the following components:  ?    Result Value  ? Glucose-Capillary 265 (*)   ? All other components within normal limits  ?POCT URINALYSIS DIPSTICK, ED / UC - Abnormal; Notable for the following components:  ? Glucose, UA 500 (*)   ? Bilirubin Urine SMALL (*)   ? Ketones, ur >=160 (*)   ? Hgb urine dipstick MODERATE (*)   ?  Protein, ur 100 (*)   ? All other components within normal limits  ? ? ? ?Not on File ?                                            ?Past Medical History:  ?Diagnosis Date  ? Carpal tunnel syndrome   ? Diabetes mellitus without complication ([place])   ? Gestational diabetes   ? Hearing impaired   ? Numbness   ? Obesity   ? ?Social History  ? ?Socioeconomic History  ? Marital status: Single  ?  Spouse name: Not on file  ? Number of children: 2  ?  Years of education: GED  ? Highest education level: Not on file  ?Occupational History  ? Not on file  ?Tobacco Use  ? Smoking status: Never  ? Smokeless tobacco: Never  ?Substance and Sexual Activity  ? Alcohol use: Yes  ?  Comment: occassional  ? Drug use: Yes  ?  Types: Marijuana  ? Sexual activity: Not on file  ?Other Topics Concern  ? Not on file  ?Social History Narrative  ? Patient lives at home with her two children.  ? Patient is unemployed.  ? Education GED.  ? Right handed.  ? ?Social Determinants of Health  ? ?Financial Resource Strain: Not on file  ?Food Insecurity: Not on file  ?Transportation Needs: Not on file  ?Physical Activity: Not on file  ?Stress: Not on file  ?Social Connections: Not on file  ?Intimate Partner Violence: Not on file  ? ?No family history on file. ? ?  ?Vanessa Kick, MD ?12/10/21 1910 ? ?

## 2021-12-10 NOTE — ED Triage Notes (Signed)
Pt here POV with complaints of feeling slow and sluggish since Tuesday morning since getting off work and eating a rice crispy treat. Pt also states she's been having fevers of 103. Pt also stated she was checking her CBG at home and her CBG was 366. Pt supposed to be on diabetic medication but hasnt been taking due to insurance reasons/cost. Pt with complaints of urinary frequency, n/v/d, Denies abdominal pain. Pt Aox4.  ?

## 2021-12-10 NOTE — H&P (Addendum)
? ? ? ?Date: 12/11/2021     ?     ?     ?Patient Name:  Sherry Morris MRN: [id number]  ?DOB: 1981-01-19 Age / Sex: 41 y.o., female   ?PCP: Bartholome Bill, MD    ?     ?Medical Service: Internal Medicine Teaching Service    ?     ?Attending Physician: Dr. Heber Mayfield, Rachel Moulds, DO    ?First Contact: Dr. Raymondo Band Pager: [phone]  ?Second Contact: Dr. Coy Saunas Pager: [phone]  ?     ?After Hours (After 5p/  First Contact Pager: [phone]  ?weekends / holidays): Second Contact Pager: [phone]  ? ?Chief Complaint: Worsening fatigue ? ?History of Present Illness:  ?Patient is 41 year old female with past medical history of diabetes with history of gestational diabetes, obesity with BMI of 53, Hidradenitis suppurativa, partial bilateral hearing loss, and depression presenting to ED with complaint of fatigue. She states her last normal was Tuesday and has been feeling week since Wednesday morning. She works night shift and took her temperature Wednesday morning and it was 103.9. She used her father's glucometer to check her glucose level and it was around 300. He gave her a diabetes medication and that helped her feel better.  ? ?She had decreased appetite and an episode of vomiting this morning. She states she vomited clear liquid due to poor oral intake. She also stated she had diarrhea but no hematochezia or melena. She endorsed increased urination and thirst. She denied any abdominal pain, chills, coughing, or chest pain and denied any sick contacts. She did endorse urinary incontinence and states she has vaginal pain but not burning with urination. She does endorse vaginal bleeding since a couple of days ago and states she does not have periods due to placement of her IUD. She was treated for yeast infections 2 weeks ago.  ? ?She reports being diagnosed with diabetes during surgical clearance for her weight loss surgery. She was initially started on Metformin but discontinued due to GI side effects. Then she was started  on Jardiance but could not afford it and last took 3-4 months ago. She does not check her glucose regularly as she does not like to stick herself and states she does not want injectable medications. Only other medications she takes is ibuprofen as needed for knee pain.  ? ?She reported history of depression but states she does not want pharmacologic therapy as she had previous suicide attempt from drug overdose. She started seeing a therapist this week. No current HI/SI. ? ?Review of Systems: Negative unless stated in the HPI.  ? ? ?ED Course:  ?Patient presented with worsening fatigue to the ED. Lab work ordered and showed patient being in DKA (see below). Patient given IV Zofran for nausea and started on IVF. Patient placed on endo-tool. IMTS contacted for admission. ? ? ?Past Medical History:  ?Diagnosis Date  ? Carpal tunnel syndrome   ? Diabetes mellitus without complication ([place])   ? Gestational diabetes   ? Hearing impaired   ? Numbness   ? Obesity   ? ?Meds:  ?Has Jardiance prescribed but not taking due to cost.  ?Ibuprofen 200 mg prn ?Mirena placed in 2010 ?Current Meds  ?Medication Sig  ? ibuprofen (ADVIL) 200 MG tablet Take 200 mg by mouth every 6 (six) hours as needed for moderate pain.  ? levonorgestrel (MIRENA) 20 MCG/24HR IUD 1 each by Intrauterine route once. June 2018  ? ?Allergies: No known allergies  ?  Allergies as of 12/10/2021  ? (No Known Allergies)  ? ?Family History:  ?Mom-CHF, HTN, depression ?Dad:DM2, depression ?Brother:HTN, DM2, depression ?Sister:Depression ? ?Social History:  ?Lives with 2 kids and her dad stays from time to time. 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  ?PCP is Dr. Luciana Axe.  ?Does not smoke cigarettes, social alcohol use, marijuana use once a month, no other illicit substance use.  ? ?Physical Exam: ?Blood pressure (!) 142/82, pulse (!) 103, temperature 98.4 ?F (36.9 ?C), temperature source Oral, resp. rate 16, height '5\' 4"'$   (1.626 m), weight (!) 140.2 kg, SpO2 96 %, unknown if currently breastfeeding. ?General: Obese female in NAD ?Head: Normocephalic ?Eyes: PEERLA, nonicteric sclera ?Mouth and Throat: MMM ?Lungs: CTAB  ?Cardiovascular: tachycardic, sinus rhythm, 2+ pulses in all extremities ?Abdomen: Normal bowel sounds, No TTP ?MSK: Normal bulk and tone ?Skin: Darkening of skin behind neck, right axilla has small lesion consistent with HS but non-draining with small sinus tract visible.  ?Neuro: Alert and oriented x4, CN grossly intact, normal sensation ?Psych: Normal mood and normal affect ? ?CBC Latest Ref Rng & Units 12/10/2021 12/10/2021 08/16/2014  ?WBC 4.0 - 10.5 K/uL - 12.2(H) 9.5  ?Hemoglobin 12.0 - 15.0 g/dL 13.6 13.5 10.8(L)  ?Hematocrit 36.0 - 46.0 % 40.0 42.7 33.9(L)  ?Platelets 150 - 400 K/uL - 253 256  ?  ?CMP Latest Ref Rng & Units 12/11/2021 12/10/2021 12/10/2021  ?Glucose 70 - 99 mg/dL 157(H) 168(H) -  ?BUN 6 - 20 mg/dL 9 11 -  ?Creatinine 0.44 - 1.00 mg/dL 0.73 0.78 -  ?Sodium 135 - 145 mmol/L 136 135 135  ?Potassium 3.5 - 5.1 mmol/L 3.8 3.5 5.4(H)  ?Chloride 98 - 111 mmol/L 102 101 -  ?CO2 22 - 32 mmol/L 17(L) 16(L) -  ?Calcium 8.9 - 10.3 mg/dL 8.8(L) 8.6(L) -  ?Total Protein 6.5 - 8.1 g/dL - - -  ?Total Bilirubin 0.3 - 1.2 mg/dL - - -  ?Alkaline Phos 38 - 126 U/L - - -  ?AST 15 - 41 U/L - - -  ?ALT 0 - 44 U/L - - -  ?  ?DG Chest 1 View ? ?Result Date: 12/10/2021 ?CLINICAL DATA:  Hyperglycemia,  fevers and sluggishness. EXAM: CHEST  1 VIEW COMPARISON:  None. FINDINGS: The heart size and mediastinal contours are within normal limits. Both lungs are clear. The visualized skeletal structures are unremarkable. IMPRESSION: No active disease. Electronically Signed   By: Keane Police D.O.   On: 12/10/2021 18:14  ?  ?CXR: personally reviewed my interpretation is no acute disease process.  ? ?Assessment & Plan by Problem: ?Patient is a 41 year old female with past medical history of obesity class 3, diabetes mellitus with  non-adherence to her anti-diabetic regimen presenting with 2 days of worsening fatigue and GI symptoms with lab work showing glucose of 281 and BHB of 6.78 with elevated anion gap, UA showing glucosuria, and ketonuria suggestive of DKA in setting of uncontrolled diabetes. ? ?#DKA ?#DMII vs DMI ?Patient presented with fatigue and reported non-adherence to her diabetic regimen due to issues with cost and her fear sticking herself along with her busy work herself. Patient unaware if she has T1DM or T2DM. Given elevated glucose, elevated BHB, and ketonuria suggest diagnosis of DKA in setting of uncontrolled diabetes. A infectious cause was considered to be precipitating factor but unlikely given negative UA, negative CXR and no other signs suggestive of infection.  Her initial hyperthermia is most likely secondary to her dehydration as patient was afebrile on presentation and without any antipyretics. It is unclear if patient has T1DM or T2DM but with her obesity and oral treatment, it does appear to be T2DM. ?-- Continue on insulin drip ?-- Potassium supplementation as needed for hypokalemia ?-- Keep NPO ?-- BMET every 4 hours ?-- CBG Q1H ?-- Once anion gap closed ?2, start CM diet and if able to eat, and start injectable insulin ?-- Continue insulin drip for 1-2 more hours, then discontinue and start SSI-S  ?-- DC fluids if eating, drinking, and off insulin drip ?-- f/u c-peptide, anti-islet antibody, Glutamic acid decarboxylase antibody ? ?#Class III Obesity ?Patient has BMI of 53.04. She states she was undergoing surgical clearance for weight loss surgery when she was told she had diabetes. She has hx of elevated TSH but normal FT4 but no T3. Will defer repeat testing during acute illness. I counseled patient on benefits of healthy weight on overall health and significance of any weight loss on her overall health.  ?-Encourage lifestyle medications ?-Repeat outpatient thyroid function test if appropriate.   ? ?#Hidradenitis Suppurativa ?Patient has hx of hidradenitis supprativa. On exam, she has one non-draining lesion in right axilla with small sinus tract noted. ?-Warm compresses  ?-CTM   ? ?#Depression ?Patient recentl

## 2021-12-10 NOTE — ED Notes (Signed)
Provider made aware of patients glucose of 180. Provider will order repeat BMP at this time before this RN to titrate insulin drip. ?

## 2021-12-10 NOTE — ED Provider Triage Note (Signed)
Emergency Medicine Provider Triage Evaluation Note ? ?Sherry Morris , a 41 y.o. female  was evaluated in triage.  41 year old female with past medical history of type 2 diabetes presents today for evaluation of 3 days of not feeling well including abdominal pain, fatigue, shortness of breath, and as of today nausea and vomiting.  Patient went to urgent care and was referred to the emergency room.  Patient states she has been on Jardiance which she was unable to afford.  She is not on insulin.  She reports fever at home of 103.0.  Denies any recent illness.  Denies cough. ? ?Review of Systems  ?Positive: As above ?Negative: As above ? ?Physical Exam  ?BP 128/81 (BP Location: Left Arm)   Pulse (!) 130   Temp 99.1 ?F (37.3 ?C) (Oral)   Resp 20   Ht '5\' 4"'$  (1.626 m)   Wt (!) 140.2 kg   SpO2 95%   BMI 53.04 kg/m?  ?Gen:   Awake, no distress   ?Resp:  Normal effort  ?MSK:   Moves extremities without difficulty  ?Other:   ? ?Medical Decision Making  ?Medically screening exam initiated at 5:44 PM.  Appropriate orders placed.  Sherry Morris was informed that the remainder of the evaluation will be completed by another provider, this initial triage assessment does not replace that evaluation, and the importance of remaining in the ED until their evaluation is complete. ? ? ?  ?Evlyn Courier, PA-C ?12/10/21 1745 ? ?

## 2021-12-10 NOTE — ED Triage Notes (Signed)
Pt reports blood sugar is high. She has not had anything to eat in two days. She stated her blood sugar was 353 this morning.  ?

## 2021-12-10 NOTE — ED Provider Notes (Signed)
?[facility] ?Provider Note ? ? ?CSN: [id number] ?Arrival date & time: 12/10/21  1720 ?  ? ?History ?Chief Complaint  ?Patient presents with  ? Hyperglycemia  ? ? ?Sherry Morris is a 41 y.o. female with past medical history of type 2 diabetes presenting for abdominal pain 3 days of feeling unwell with associated fever, fatigue, shortness of breath, nausea, and vomiting.  Initially presented to urgent care was referred to come to the ED immediately.  She states she has not eaten in days.  Patient has been on Jardiance but has been unable to afford it due to insurance.  Fever 103.0 at home.  She denies any chest pain, shortness of breath, or diarrhea. ? ?The history is provided by the patient and a friend.  ?Hyperglycemia ?Associated symptoms: fatigue and fever   ? ?  ? ?Home Medications ?Prior to Admission medications   ?Medication Sig Start Date End Date Taking? Authorizing Provider  ?cyclobenzaprine (FLEXERIL) 10 MG tablet Take 1 tablet (10 mg total) by mouth 2 (two) times daily as needed for muscle spasms. 10/08/18   Khatri, Hina, PA-C  ?ibuprofen (ADVIL) 800 MG tablet Take 1 tablet (800 mg total) by mouth 3 (three) times daily. 01/08/20   Wieters, Hallie C, PA-C  ?levonorgestrel (MIRENA) 20 MCG/24HR IUD 1 each by Intrauterine route once. June 2018    [provider]  ?methocarbamol (ROBAXIN) 500 MG tablet Take 1 tablet (500 mg total) by mouth 2 (two) times daily. 07/12/18   Okey Regal, PA-C  ?   ? ?Allergies    ?Patient has no allergy information on record.   ? ?Review of Systems   ?Review of Systems  ?Constitutional:  Positive for activity change, appetite change, fatigue and fever.  ? ?Physical Exam ?Updated Vital Signs ?BP 128/81 (BP Location: Left Arm)   Pulse (!) 130   Temp 99.1 ?F (37.3 ?C) (Oral)   Resp 20   Ht '5\' 4"'$  (1.626 m)   Wt (!) 140.2 kg   SpO2 95%   BMI 53.04 kg/m?  ?Physical Exam ?Vitals and nursing note reviewed.  ?Constitutional:   ?    Appearance: She is obese. She is ill-appearing.  ?Cardiovascular:  ?   Rate and Rhythm: Normal rate and regular rhythm.  ?Pulmonary:  ?   Effort: Pulmonary effort is normal. No respiratory distress.  ?Abdominal:  ?   Tenderness: There is no abdominal tenderness. There is no guarding or rebound.  ?Musculoskeletal:  ?   Right lower leg: No edema.  ?   Left lower leg: No edema.  ?Neurological:  ?   Mental Status: She is alert and oriented to person, place, and time.  ? ? ?ED Results / Procedures / Treatments   ?Labs ?(all labs ordered are listed, but only abnormal results are displayed) ?Labs Reviewed  ?RESP PANEL BY RT-PCR (FLU A&B, COVID) ARPGX2  ?CBC  ?URINALYSIS, ROUTINE W REFLEX MICROSCOPIC  ?COMPREHENSIVE METABOLIC PANEL  ?LIPASE, BLOOD  ?BETA-HYDROXYBUTYRIC ACID  ?I-STAT BETA HCG BLOOD, ED ([facility], [facility], [facility] ONLY)  ?CBG MONITORING, ED  ? ? ?EKG ?None ? ?Radiology ?DG Chest 1 View ? ?Result Date: 12/10/2021 ?CLINICAL DATA:  Hyperglycemia,  fevers and sluggishness. EXAM: CHEST  1 VIEW COMPARISON:  None. FINDINGS: The heart size and mediastinal contours are within normal limits. Both lungs are clear. The visualized skeletal structures are unremarkable. IMPRESSION: No active disease. Electronically Signed   By: Keane Police D.O.   On: 12/10/2021 18:14   ? ?  Procedures ?Procedures  ? ? ?Medications Ordered in ED ?Medications - No data to display ? ?ED Course/ Medical Decision Making/ A&P ?  ?                        ?Medical Decision Making ?Amount and/or Complexity of Data Reviewed ?Labs: ordered. ? ?Risk ?OTC drugs. ?Prescription drug management. ?Decision regarding hospitalization. ? ? ?41 year old female with type 2 diabetes, HS,depression presenting to the ED for high blood sugars in the context of being out of her Jardiance due to cost.  Patient endorses nausea, vomiting, and decreased p.o.   ? ?On exam, ill-appearing and tachycardic.  Temperature 99.1 on arrival.  Chest x-ray with no focal findings on my interpretation  which confirmed by radiology.  Given Tylenol on arrival.  Labs concerning for DKA given anion gap of 24 and beta hydroxybutyrate of 6.78.  Labs remarkable for leukocytosis of 12.2, sodium of 134, chloride 96, CO2 14, glucose 281, creatinine of 0.09.  COVID and flu negative.  Lipase within normal limits.  UA with ketones and glucose noted.  Patient given multiple liter bolus of fluids.  CXR w/ no focal findings on my review confirmed by radiology. Patient started on insulin drip at 0.1 mg/kg with supplemental potassium.  Due to patient's sugars being low, started on dextrose containing fluids.  Anion gap after multiple hours is now 15.  Suspect she will be able to subcutaneous insulin transition soon.  Patient is developing a headache at this time and given Tylenol for the discomfort.  Seeking admission for DKA and glucose management.  Patient verbalized and agreed agreed to admission.  Care transition to inpatient team. ? ?Patient's care and management overseen by my attending physician, Dr. Vanita Panda, who agreed with plan. ? ?Patient's care is an acute threat to life and indicates admission. ? ?Final Clinical Impression(s) / ED Diagnoses ?Final diagnoses:  ?Hyperglycemia  ? ? ?Rx / DC Orders ?ED Discharge Orders   ? ? None  ? ?  ? ? ?  ?Lupita Dawn, MD ?12/11/21 1123 ? ?  ?Carmin Muskrat, MD ?12/11/21 2314 ? ?

## 2021-12-11 ENCOUNTER — Other Ambulatory Visit: Payer: Self-pay

## 2021-12-11 ENCOUNTER — Encounter (HOSPITAL_COMMUNITY): Payer: Self-pay | Admitting: Internal Medicine

## 2021-12-11 DIAGNOSIS — E876 Hypokalemia: Secondary | ICD-10-CM | POA: Diagnosis present

## 2021-12-11 DIAGNOSIS — E1365 Other specified diabetes mellitus with hyperglycemia: Secondary | ICD-10-CM | POA: Diagnosis not present

## 2021-12-11 DIAGNOSIS — L732 Hidradenitis suppurativa: Secondary | ICD-10-CM | POA: Diagnosis present

## 2021-12-11 DIAGNOSIS — Z6841 Body Mass Index (BMI) 40.0 and over, adult: Secondary | ICD-10-CM | POA: Diagnosis not present

## 2021-12-11 DIAGNOSIS — Z818 Family history of other mental and behavioral disorders: Secondary | ICD-10-CM | POA: Diagnosis not present

## 2021-12-11 DIAGNOSIS — Z975 Presence of (intrauterine) contraceptive device: Secondary | ICD-10-CM | POA: Diagnosis not present

## 2021-12-11 DIAGNOSIS — E039 Hypothyroidism, unspecified: Secondary | ICD-10-CM | POA: Diagnosis present

## 2021-12-11 DIAGNOSIS — Z8632 Personal history of gestational diabetes: Secondary | ICD-10-CM | POA: Diagnosis not present

## 2021-12-11 DIAGNOSIS — J189 Pneumonia, unspecified organism: Secondary | ICD-10-CM | POA: Diagnosis not present

## 2021-12-11 DIAGNOSIS — F329 Major depressive disorder, single episode, unspecified: Secondary | ICD-10-CM | POA: Diagnosis present

## 2021-12-11 DIAGNOSIS — E1110 Type 2 diabetes mellitus with ketoacidosis without coma: Secondary | ICD-10-CM | POA: Diagnosis present

## 2021-12-11 DIAGNOSIS — Z20822 Contact with and (suspected) exposure to covid-19: Secondary | ICD-10-CM | POA: Diagnosis present

## 2021-12-11 DIAGNOSIS — E860 Dehydration: Secondary | ICD-10-CM | POA: Diagnosis present

## 2021-12-11 DIAGNOSIS — E1310 Other specified diabetes mellitus with ketoacidosis without coma: Secondary | ICD-10-CM

## 2021-12-11 DIAGNOSIS — Z833 Family history of diabetes mellitus: Secondary | ICD-10-CM | POA: Diagnosis not present

## 2021-12-11 DIAGNOSIS — Z79899 Other long term (current) drug therapy: Secondary | ICD-10-CM | POA: Diagnosis not present

## 2021-12-11 DIAGNOSIS — R509 Fever, unspecified: Secondary | ICD-10-CM | POA: Diagnosis not present

## 2021-12-11 DIAGNOSIS — D259 Leiomyoma of uterus, unspecified: Secondary | ICD-10-CM | POA: Diagnosis present

## 2021-12-11 DIAGNOSIS — H9193 Unspecified hearing loss, bilateral: Secondary | ICD-10-CM | POA: Diagnosis present

## 2021-12-11 DIAGNOSIS — R32 Unspecified urinary incontinence: Secondary | ICD-10-CM | POA: Diagnosis present

## 2021-12-11 LAB — GLUCOSE, CAPILLARY
Glucose-Capillary: 111 mg/dL — ABNORMAL HIGH (ref 70–99)
Glucose-Capillary: 135 mg/dL — ABNORMAL HIGH (ref 70–99)
Glucose-Capillary: 140 mg/dL — ABNORMAL HIGH (ref 70–99)
Glucose-Capillary: 142 mg/dL — ABNORMAL HIGH (ref 70–99)
Glucose-Capillary: 149 mg/dL — ABNORMAL HIGH (ref 70–99)
Glucose-Capillary: 150 mg/dL — ABNORMAL HIGH (ref 70–99)
Glucose-Capillary: 155 mg/dL — ABNORMAL HIGH (ref 70–99)
Glucose-Capillary: 156 mg/dL — ABNORMAL HIGH (ref 70–99)
Glucose-Capillary: 159 mg/dL — ABNORMAL HIGH (ref 70–99)
Glucose-Capillary: 159 mg/dL — ABNORMAL HIGH (ref 70–99)
Glucose-Capillary: 162 mg/dL — ABNORMAL HIGH (ref 70–99)
Glucose-Capillary: 164 mg/dL — ABNORMAL HIGH (ref 70–99)
Glucose-Capillary: 189 mg/dL — ABNORMAL HIGH (ref 70–99)
Glucose-Capillary: 232 mg/dL — ABNORMAL HIGH (ref 70–99)
Glucose-Capillary: 253 mg/dL — ABNORMAL HIGH (ref 70–99)

## 2021-12-11 LAB — HEMOGLOBIN A1C
Hgb A1c MFr Bld: 11.9 % — ABNORMAL HIGH (ref 4.8–5.6)
Mean Plasma Glucose: 294.83 mg/dL

## 2021-12-11 LAB — CBC WITH DIFFERENTIAL/PLATELET
Abs Immature Granulocytes: 0.03 10*3/uL (ref 0.00–0.07)
Basophils Absolute: 0 10*3/uL (ref 0.0–0.1)
Basophils Relative: 0 %
Eosinophils Absolute: 0 10*3/uL (ref 0.0–0.5)
Eosinophils Relative: 1 %
HCT: 34.4 % — ABNORMAL LOW (ref 36.0–46.0)
Hemoglobin: 11.2 g/dL — ABNORMAL LOW (ref 12.0–15.0)
Immature Granulocytes: 0 %
Lymphocytes Relative: 27 %
Lymphs Abs: 2.2 10*3/uL (ref 0.7–4.0)
MCH: 24.8 pg — ABNORMAL LOW (ref 26.0–34.0)
MCHC: 32.6 g/dL (ref 30.0–36.0)
MCV: 76.1 fL — ABNORMAL LOW (ref 80.0–100.0)
Monocytes Absolute: 0.9 10*3/uL (ref 0.1–1.0)
Monocytes Relative: 11 %
Neutro Abs: 4.9 10*3/uL (ref 1.7–7.7)
Neutrophils Relative %: 61 %
Platelets: 209 10*3/uL (ref 150–400)
RBC: 4.52 MIL/uL (ref 3.87–5.11)
RDW: 14.9 % (ref 11.5–15.5)
WBC: 8.1 10*3/uL (ref 4.0–10.5)
nRBC: 0 % (ref 0.0–0.2)

## 2021-12-11 LAB — BASIC METABOLIC PANEL
Anion gap: 17 — ABNORMAL HIGH (ref 5–15)
Anion gap: 14 (ref 5–15)
Anion gap: 13 (ref 5–15)
Anion gap: 10 (ref 5–15)
Anion gap: 11 (ref 5–15)
BUN: 9 mg/dL (ref 6–20)
BUN: 9 mg/dL (ref 6–20)
BUN: 9 mg/dL (ref 6–20)
BUN: 8 mg/dL (ref 6–20)
BUN: 7 mg/dL (ref 6–20)
CO2: 17 mmol/L — ABNORMAL LOW (ref 22–32)
CO2: 22 mmol/L (ref 22–32)
CO2: 21 mmol/L — ABNORMAL LOW (ref 22–32)
CO2: 23 mmol/L (ref 22–32)
CO2: 25 mmol/L (ref 22–32)
Calcium: 8.8 mg/dL — ABNORMAL LOW (ref 8.9–10.3)
Calcium: 8.8 mg/dL — ABNORMAL LOW (ref 8.9–10.3)
Calcium: 8.6 mg/dL — ABNORMAL LOW (ref 8.9–10.3)
Calcium: 8.4 mg/dL — ABNORMAL LOW (ref 8.9–10.3)
Calcium: 8.5 mg/dL — ABNORMAL LOW (ref 8.9–10.3)
Chloride: 102 mmol/L (ref 98–111)
Chloride: 102 mmol/L (ref 98–111)
Chloride: 103 mmol/L (ref 98–111)
Chloride: 103 mmol/L (ref 98–111)
Chloride: 100 mmol/L (ref 98–111)
Creatinine, Ser: 0.73 mg/dL (ref 0.44–1.00)
Creatinine, Ser: 0.69 mg/dL (ref 0.44–1.00)
Creatinine, Ser: 0.64 mg/dL (ref 0.44–1.00)
Creatinine, Ser: 0.65 mg/dL (ref 0.44–1.00)
Creatinine, Ser: 0.71 mg/dL (ref 0.44–1.00)
GFR, Estimated: >60 mL/min (ref >60)
GFR, Estimated: >60 mL/min (ref >60)
GFR, Estimated: >60 mL/min (ref >60)
GFR, Estimated: >60 mL/min (ref >60)
GFR, Estimated: >60 mL/min (ref >60)
Glucose, Bld: 157 mg/dL — ABNORMAL HIGH (ref 70–99)
Glucose, Bld: 168 mg/dL — ABNORMAL HIGH (ref 70–99)
Glucose, Bld: 146 mg/dL — ABNORMAL HIGH (ref 70–99)
Glucose, Bld: 168 mg/dL — ABNORMAL HIGH (ref 70–99)
Glucose, Bld: 174 mg/dL — ABNORMAL HIGH (ref 70–99)
Potassium: 3.8 mmol/L (ref 3.5–5.1)
Potassium: 3.6 mmol/L (ref 3.5–5.1)
Potassium: 3.6 mmol/L (ref 3.5–5.1)
Potassium: 3.5 mmol/L (ref 3.5–5.1)
Potassium: 3.1 mmol/L — ABNORMAL LOW (ref 3.5–5.1)
Sodium: 136 mmol/L (ref 135–145)
Sodium: 138 mmol/L (ref 135–145)
Sodium: 137 mmol/L (ref 135–145)
Sodium: 136 mmol/L (ref 135–145)
Sodium: 136 mmol/L (ref 135–145)

## 2021-12-11 LAB — CBG MONITORING, ED
Glucose-Capillary: 151 mg/dL — ABNORMAL HIGH (ref 70–99)
Glucose-Capillary: 153 mg/dL — ABNORMAL HIGH (ref 70–99)

## 2021-12-11 LAB — HIV ANTIBODY (ROUTINE TESTING W REFLEX): HIV Screen 4th Generation wRfx: NONREACTIVE

## 2021-12-11 LAB — BETA-HYDROXYBUTYRIC ACID: Beta-Hydroxybutyric Acid: 2.5 mmol/L — ABNORMAL HIGH (ref 0.05–0.27)

## 2021-12-11 MED ORDER — ASPIRIN-ACETAMINOPHEN-CAFFEINE 250-250-65 MG PO TABS
1.0000 | ORAL_TABLET | Freq: Once | ORAL | Status: AC
  Administered 2021-12-11: 1 via ORAL
  Filled 2021-12-11: qty 1

## 2021-12-11 MED ORDER — ACETAMINOPHEN 325 MG PO TABS
650.0000 mg | ORAL_TABLET | Freq: Four times a day (QID) | ORAL | Status: DC | PRN
  Administered 2021-12-11 – 2021-12-13 (×3): 650 mg via ORAL
  Filled 2021-12-11 (×2): qty 2

## 2021-12-11 MED ORDER — IBUPROFEN 400 MG PO TABS
400.0000 mg | ORAL_TABLET | Freq: Once | ORAL | Status: AC
  Administered 2021-12-11: 400 mg via ORAL
  Filled 2021-12-11: qty 1

## 2021-12-11 MED ORDER — POTASSIUM CHLORIDE 10 MEQ/100ML IV SOLN
10.0000 meq | INTRAVENOUS | Status: AC
  Administered 2021-12-11 (×4): 10 meq via INTRAVENOUS
  Filled 2021-12-11 (×4): qty 100

## 2021-12-11 MED ORDER — INSULIN ASPART 100 UNIT/ML IJ SOLN
0.0000 [IU] | Freq: Three times a day (TID) | INTRAMUSCULAR | Status: DC
  Administered 2021-12-12: 3 [IU] via SUBCUTANEOUS
  Administered 2021-12-12: 5 [IU] via SUBCUTANEOUS

## 2021-12-11 MED ORDER — ACETAMINOPHEN 500 MG PO TABS: 1000.0000 mg | ORAL_TABLET | Freq: Once | ORAL | Status: DC

## 2021-12-11 MED ORDER — INSULIN GLARGINE-YFGN 100 UNIT/ML ~~LOC~~ SOLN
13.0000 [IU] | SUBCUTANEOUS | Status: DC
  Administered 2021-12-11: 13 [IU] via SUBCUTANEOUS
  Filled 2021-12-11 (×2): qty 0.13

## 2021-12-11 MED ORDER — RIVAROXABAN 10 MG PO TABS
10.0000 mg | ORAL_TABLET | Freq: Every day | ORAL | Status: DC
  Administered 2021-12-11 – 2021-12-15 (×5): 10 mg via ORAL
  Filled 2021-12-11 (×6): qty 1

## 2021-12-11 MED ORDER — INSULIN GLARGINE-YFGN 100 UNIT/ML ~~LOC~~ SOLN: 10.0000 [IU] | Freq: Every day | SUBCUTANEOUS | Status: DC

## 2021-12-11 MED ORDER — POTASSIUM CHLORIDE 20 MEQ PO PACK
40.0000 meq | PACK | Freq: Two times a day (BID) | ORAL | Status: DC
  Administered 2021-12-12: 40 meq via ORAL
  Filled 2021-12-11: qty 2

## 2021-12-11 NOTE — Progress Notes (Signed)
? ?HD#1 ?SUBJECTIVE:  ?Patient Summary: Sherry Morris is a 41 y.o. with a pertinent PMH of diabetes, obesity, hidradenitis suppurativa, partial bilateral hearing loss, and depression, who presented with fatigue and admitted for DKA.  ? ?Overnight Events: No acute events overnight ? ?Interim History: This is hospital day 1 for this patient who was seen and evaluated at the bedside this morning. She states that she is feeling better compared to when she first came in. She is still feeling tired. She denies any nausea or vomiting, but notes that she is hungry now.  ? ?OBJECTIVE:  ?Vital Signs: ?Vitals:  ? 12/11/21 0000 12/11/21 0030 12/11/21 0347 12/11/21 0400  ?BP: 117/66 107/88 (!) 142/82 129/74  ?Pulse: (!) 102 99 (!) 103 99  ?Resp: 16     ?Temp:   98.4 ?F (36.9 ?C)   ?TempSrc:   Oral   ?SpO2: 97% 99% 96% 92%  ?Weight:      ?Height:      ? ?Supplemental O2: Room Air ?SpO2: 92 % ? ?Filed Weights  ? 12/10/21 1734  ?Weight: (!) 140.2 kg  ? ? ?No intake or output data in the 24 hours ending 12/11/21 0703 ?Net IO Since Admission: No IO data has been entered for this period [12/11/21 0703] ? ?Physical Exam: ?General: Obese female laying in bed. No acute distress. ?CV: RRR. No murmurs, rubs, or gallops. No LE edema ?Pulmonary: Lungs CTAB. Normal effort. No wheezing or rales. ?Abdominal: Soft, nontender, nondistended. Normal bowel sounds. ?Extremities: Palpable radial and DP pulses. Normal bulk and tone. ?Skin: Darkening of skin behind neck consistent with acanthosis nigricans. Right axilla with small lesion consistent with HS, but non-draining- there is a small tract noted.  ?Neuro: A&Ox3. No focal deficit. ?Psych: Normal mood and affect ? ? ? ? ?ASSESSMENT/PLAN:  ?Assessment: ?Principal Problem: ?  Diabetic ketoacidosis ([place]) ? ? ?Plan: ?#DKA ?#Type 1 vs type 2 diabetes ?Patient presented with fatigue and reported non-adherence with her [facility] secondary to cost issues. Glucose level was 281 on admission with an  anion gap of 24, most recently anion gap down to 13. Additionally, beta hydroxybutyric acid level was elevated to 6.78 and urinalysis with 500 glucose, >160 ketones, and 100 protein. Patient was found to be in DKA and was started on IV insulin drip. Of note, A1c 11.9 and patient is unsure if she has type 1 or 2 diabetes. K low-normal at 3.6- will replete as patient is on IV insulin drip.  ?- C peptide, anti-islet cell Ab, and glutamic acid decarboxylase Ab in process ?- BMP q4h  ?- BHB at next BMP check  ?- CBGs q1h  ?- Continue endotool until anion gap closes x2 OR when beta hydroxybutyric acid normalizes ?- Give Kcl 10 mEq x 4 runs  ? ?#Hidradenitis suppurativa ?Patient has a hx of hidradenitis suppurativa. She has one non-draining lesion in her right axilla with a small sinus tract noted. No signs of active infection.  ?- Warm compresses ? ?#Depression ?Patient diagnosed with depression and was started on CBT by [facility]- she is not on any pharmacologic therapy at this time. ? ? ?Best Practice: ?Diet: NPO ?IVF: Fluids: D5-LR, Rate: 125 cc/hr (with Endotool) ?VTE: rivaroxaban (XARELTO) tablet 10 mg Start: 12/11/21 1000 ?Code: Full ?AB: none ?DISPO: Anticipated discharge in 1-2 days to Home pending Medical stability. ? ?Signature: ?Trevelle Mcgurn, D.O.  ?Internal Medicine Resident, PGY-1 ?[facility]  ?Pager: [phone] ?7:03 AM, 12/11/2021  ? ?Please contact the on call  pager after 5 pm and on weekends at [phone]. ? ?

## 2021-12-11 NOTE — Progress Notes (Addendum)
Inpatient Diabetes Program Recommendations ? ?AACE/ADA: New Consensus Statement on Inpatient Glycemic Control (2015) ? ?Target Ranges:  Prepandial:   less than 140 mg/dL ?     Peak postprandial:   less than 180 mg/dL (1-2 hours) ?     Critically ill patients:  140 - 180 mg/dL  ? ? Latest Reference Range & Units 12/10/21 17:48  ?Sodium 135 - 145 mmol/L 134 (L)  ?Potassium 3.5 - 5.1 mmol/L 3.8  ?Chloride 98 - 111 mmol/L 96 (L)  ?CO2 22 - 32 mmol/L 14 (L)  ?Glucose 70 - 99 mg/dL 281 (H)  ?BUN 6 - 20 mg/dL 12  ?Creatinine 0.44 - 1.00 mg/dL 1.09 (H)  ?Calcium 8.9 - 10.3 mg/dL 9.4  ?Anion gap 5 - 15  24 (H)  ? ? Latest Reference Range & Units 12/10/21 17:48  ?Beta-Hydroxybutyric Acid 0.05 - 0.27 mmol/L 6.78 (H)  ?(H): Data is abnormally high ? Latest Reference Range & Units 12/11/21 06:16  ?Hemoglobin A1C 4.8 - 5.6 % 11.9 (H) ? ?(294 mg/dl)  ?(H): Data is abnormally high ? Latest Reference Range & Units 12/10/21 18:25 12/10/21 21:18 12/10/21 22:34 12/11/21 00:29 12/11/21 01:41 12/11/21 04:51 12/11/21 06:52 12/11/21 07:59 12/11/21 10:23  ?Glucose-Capillary 70 - 99 mg/dL 271 (H) 203 (H) ? ?IV Insulin Drip Started @2119  180 (H) 153 (H) 151 (H) 155 (H) 162 (H) 150 (H) 135 (H) ? ?IV Insulin Drip Infusing  ? ? ?Admit with: DKA ?Type 1 Diabetes versus Type 2 Diabetes ? ?History: DM, Hidradenitis Suppurativa ? ?Home DM Meds: None listed ? ?Current Orders: IV Insulin Drip ? ? ?Per MD notes:  ?She reports being diagnosed with diabetes during surgical clearance for her weight loss surgery. She was initially started on Metformin but discontinued due to GI side effects. Then she was started on Jardiance but could not afford it and last took 3-4 months ago. She does not check her glucose regularly as she does not like to stick herself and states she does not want injectable medications ? ?C-peptide, anti-islet antibody, Glutamic acid decarboxylase antibody levels ordered ? ?MD- Pt interested in Freestyle Libre 2 CGM glucose monitoring  system.  Diabetes team does have samples and can provide 2 sensors to pt prior to discharge if you allow. ? ?Order Number for the [place] 2 Sensor would be [id number] ? ? ? ? ?Met w/ pt at bedside around 12pm.  Pt told me she was given Metformin initially at time of diabetes diagnosis about 1 year ago by her PCP.  Had severe GI issues with the Metformin and stopped taking it.  A1c rose to 7.9% and PCP started pt on Jardiance.  Pt went to get refill on the Jardiance about 4 months ago and the cost was $500--pt told me she did not pick up the refill and has not been taking any meds since.  Reviewed admission diagnosis with pt--discussed with patient diagnosis of DKA (pathophysiology), treatment of DKA, lab results, and transition plan to SQ insulin regimen.  Also discussed with pt that the Medicine team is checking C-peptide, anti-islet antibody, Glutamic acid decarboxylase antibody levels--explained what each of these labs are and why we are checking them to see if pt trending more like Type 1 or Type 2 diabetes.  Discussed with pt that she may need insulin when she is discharged.  Pt began crying when I told her this and we discussed her fears of insulin injections.  Pt was open to re-learning insulin pen (has given her friend an  insulin injection with an insulin pen and has watched her dad give him self insulin with an insulin pen)--Dad lives with pt at home.  Educated patient on insulin pen use at home.  Reviewed all steps of insulin pen including attachment of needle, 2-unit air shot, dialing up dose, giving injection, rotation of injection sites, removing needle, disposal of sharps, storage of unused insulin, disposal of insulin etc.  Patient able to provide successful return demonstration.  Reviewed troubleshooting with insulin pen.  Also reviewed Signs/Symptoms of Hypoglycemia with patient and how to treat Hypoglycemia at home.  Have asked RNs caring for patient to please allow patient to give all injections here in  hospital as much as possible for practice.   ? ?Pt also asked me about once weekly injectable meds like Ozempic or Trulicity.  Explained what these meds do and how they work.  Unsure if cost would be prohibitive.  Have alerted MD Attending about my conversation with patient and how pt would be interested in [facility] 2 CGM and GLP-1 once weekly injectable.  Perhaps TOC team or pharmacy could provide benefits check on insulin and/or GLP-1?  Called Attending team and alerted to all of the above info.  Permission given to me to provide pt with 2 sample Freestyle Libre samples.  Also, requested Attending team perform benefits check for all meds (insulin, etc) prior to d/c to make sure discharge regimen affordable. ? ?Addendum 2:45pm--Got permission by Attending MD to give pt Freestyle Libre 2 CGM Sample sensors X 2.  I took the sensors to pt today but did not apply one yet (did not want pt to need to remove sensor if further imaging studies needed and only allowed to give 2 samples per pt).  Verbally reviewed with pt how to set up and apply the CGM sensor to upper back arm.  Pt educated on application and use of Freestyle Libre 2 CGM.  App downloaded onto phone.  Educated pt on how to scan for 1st reading once sensor applied.  Also discussed troubleshooting with the app (toggling the NFC on/off if phone will not recognize new sensor).  Pt educated to check fingerstick CBG with traditional CBG meter when glucose reading does not match how he feels.  RN made aware that CGM applied.  If pt still here in hospital Monday 3/20, DM RN will help apply 1st sensor prior to d/c. ? ? ? ?--Will follow patient during hospitalization-- ? ?Wyn Quaker RN, MSN, CDE ?Diabetes Coordinator ?Inpatient Glycemic Control Team ?Team Pager: [phone] (8a-5p) ? ? ? ?

## 2021-12-11 NOTE — Progress Notes (Addendum)
New orders discussed with pt earlier. Pt given Lantus 13 units subcu per orders. Discussed sxs of low bld sugar and when to call staff. Discussed when to give Lantus when at home according to orders and pt working night shift.  ?

## 2021-12-11 NOTE — Progress Notes (Signed)
Finger stick 25. R/t eating dinner in the last 2 hours. Insulin drip increased to 10.5 units/hr per [facility]. Next fingerstick 2330 in which per orders Insulin drip will be dc'd. Pt is a yellow MEWS. Temp now 100.9 but pt has 3 blankets on and a hot pack to her abd r/t cramping. Pt does have rapid shallow breathing. Explained the MEWS scoring and what it means and why we are taking her VS more frequent. ?

## 2021-12-11 NOTE — Hospital Course (Addendum)
#  DKA ?#Type 1 vs type 2 diabetes ?Patient presented with fatigue and reported non-adherence with her [place] secondary to cost issues. Glucose level was 281 on admission with an anion gap of 24, most recently anion gap down to 13. Additionally, beta hydroxybutyric acid level was elevated to 6.78 and urinalysis with 500 glucose, >160 ketones, and 100 protein. Patient was found to be in DKA and was started on IV insulin drip. Of note, A1c 11.9 and patient is unsure if she has type 1 or 2 diabetes. K low-normal at 3.6- will replete as patient is on IV insulin drip. C peptide was within normal limits and anti-islet cell Ab was negative, although glutamic acid decarboxylase Ab in process. Suspect that type II DKA is likely due to diease progression and nonadherence with her medications. Patient was transitioned off of the insulin drip once anion gap closed x2 and started on subcutaneous semglee 13u, which had increased to 35u qhs by the time of discharge. She met with the diabetes coordinator who gave the patient a free sample of the Freestyle Libre 2 CGM and educated her about this. Patient discharged with Semglee 35u qhs, although will likely need this medication titrated with her outpatient PCP (has appt on 3/23). Additionally, would recommend trialing the patient on metformin extended release (she has had GI side effects with instant release). She would also benefit from GLP-1 agonist, however, may be cost prohibitive.  ? ?#Fever ?Patient intermittently became febrile to 103.40F over a couple of nights, although she remained largely asymptomatic during these episodes. She denied any shortness of breath, chest pain, abd pain, n/v/d, dysuria, vaginal pain, or vaginal discharge. She did develop a cough during her hospitalization, although COVID and flu testing were negative. Workup for PID was pursued,given patient recently was treated for yeast infection, however, gonorrhea/chlamdyia and RPR testing were negative. CT  chest/abd/pelvis was obtained given this unknown fever and showed some small nodules and ground glass opacities in the right chest that may be infectious vs inflammatory. CT also noted some hepatosplenomegaly with hepatic steatosis and a density in the left adnexa that could be a hemorrhagic cyst vs endometrioma. Also noted to have a fibroid uterus with IUD. Pelvic ultrasound was then obtained which showed uterine fibroids and a 4.5 cm simple cyst in the left adnexa. Given CT findings in the chest/lung, will empirically treat with 5 days of Augmentin and 3 days of Azithromycin. Would consider an outpatient workup for autoimmune conditions if intermittent fevers persist.  ? ?#Hypothyroidism ?Patient has a history of hypothyroidism, not on any medications. TSH elevated at 5.785, T4 1.36. These results could be confounded by acute illness, thus would recommend repeating int he outpatient setting. ? ?#Hidradenitis suppurativa ?Patient has a hx of hidradenitis suppurativa. She has one non-draining lesion in her right axilla with a small sinus tract noted- suspect that this is not the cause of her 103F fever.  ? ?#Hypokalemia, improved ?Patient was hypokalemic to 3.1, improved with one time oral supplementation.  ? ?

## 2021-12-12 ENCOUNTER — Inpatient Hospital Stay (HOSPITAL_COMMUNITY): Payer: BLUE CROSS/BLUE SHIELD

## 2021-12-12 DIAGNOSIS — E1365 Other specified diabetes mellitus with hyperglycemia: Secondary | ICD-10-CM

## 2021-12-12 DIAGNOSIS — R509 Fever, unspecified: Secondary | ICD-10-CM

## 2021-12-12 DIAGNOSIS — R739 Hyperglycemia, unspecified: Secondary | ICD-10-CM

## 2021-12-12 DIAGNOSIS — L732 Hidradenitis suppurativa: Secondary | ICD-10-CM

## 2021-12-12 LAB — CBC WITH DIFFERENTIAL/PLATELET
Abs Immature Granulocytes: 0.04 10*3/uL (ref 0.00–0.07)
Basophils Absolute: 0 10*3/uL (ref 0.0–0.1)
Basophils Relative: 0 %
Eosinophils Absolute: 0 10*3/uL (ref 0.0–0.5)
Eosinophils Relative: 0 %
HCT: 34.9 % — ABNORMAL LOW (ref 36.0–46.0)
Hemoglobin: 11.4 g/dL — ABNORMAL LOW (ref 12.0–15.0)
Immature Granulocytes: 1 %
Lymphocytes Relative: 23 %
Lymphs Abs: 1.7 10*3/uL (ref 0.7–4.0)
MCH: 24.7 pg — ABNORMAL LOW (ref 26.0–34.0)
MCHC: 32.7 g/dL (ref 30.0–36.0)
MCV: 75.5 fL — ABNORMAL LOW (ref 80.0–100.0)
Monocytes Absolute: 0.7 10*3/uL (ref 0.1–1.0)
Monocytes Relative: 10 %
Neutro Abs: 5 10*3/uL (ref 1.7–7.7)
Neutrophils Relative %: 66 %
Platelets: 198 10*3/uL (ref 150–400)
RBC: 4.62 MIL/uL (ref 3.87–5.11)
RDW: 14.8 % (ref 11.5–15.5)
WBC: 7.5 10*3/uL (ref 4.0–10.5)
nRBC: 0 % (ref 0.0–0.2)

## 2021-12-12 LAB — BASIC METABOLIC PANEL
Anion gap: 12 (ref 5–15)
BUN: 6 mg/dL (ref 6–20)
CO2: 20 mmol/L — ABNORMAL LOW (ref 22–32)
Calcium: 8.5 mg/dL — ABNORMAL LOW (ref 8.9–10.3)
Chloride: 102 mmol/L (ref 98–111)
Creatinine, Ser: 0.74 mg/dL (ref 0.44–1.00)
GFR, Estimated: >60 mL/min (ref >60)
Glucose, Bld: 250 mg/dL — ABNORMAL HIGH (ref 70–99)
Potassium: 4 mmol/L (ref 3.5–5.1)
Sodium: 134 mmol/L — ABNORMAL LOW (ref 135–145)

## 2021-12-12 LAB — GLUCOSE, CAPILLARY
Glucose-Capillary: 191 mg/dL — ABNORMAL HIGH (ref 70–99)
Glucose-Capillary: 200 mg/dL — ABNORMAL HIGH (ref 70–99)
Glucose-Capillary: 210 mg/dL — ABNORMAL HIGH (ref 70–99)
Glucose-Capillary: 234 mg/dL — ABNORMAL HIGH (ref 70–99)
Glucose-Capillary: 244 mg/dL — ABNORMAL HIGH (ref 70–99)
Glucose-Capillary: 267 mg/dL — ABNORMAL HIGH (ref 70–99)

## 2021-12-12 LAB — C-PEPTIDE: C-Peptide: 2.1 ng/mL (ref 1.1–4.4)

## 2021-12-12 LAB — TSH: TSH: 5.785 u[IU]/mL — ABNORMAL HIGH (ref 0.350–4.500)

## 2021-12-12 LAB — T4, FREE: Free T4: 1.36 ng/dL — ABNORMAL HIGH (ref 0.61–1.12)

## 2021-12-12 MED ORDER — SODIUM CHLORIDE 0.9 % IV SOLN: INTRAVENOUS | Status: AC

## 2021-12-12 MED ORDER — INSULIN GLARGINE-YFGN 100 UNIT/ML ~~LOC~~ SOLN
16.0000 [IU] | SUBCUTANEOUS | Status: DC
Start: 2021-12-12 — End: 2021-12-13
  Administered 2021-12-12: 16 [IU] via SUBCUTANEOUS
  Filled 2021-12-12 (×2): qty 0.16

## 2021-12-12 MED ORDER — POTASSIUM CHLORIDE CRYS ER 20 MEQ PO TBCR
40.0000 meq | EXTENDED_RELEASE_TABLET | Freq: Two times a day (BID) | ORAL | Status: AC
  Administered 2021-12-13 (×2): 40 meq via ORAL
  Filled 2021-12-12 (×2): qty 2

## 2021-12-12 MED ORDER — INSULIN ASPART 100 UNIT/ML IJ SOLN
0.0000 [IU] | Freq: Three times a day (TID) | INTRAMUSCULAR | Status: DC
  Administered 2021-12-12: 4 [IU] via SUBCUTANEOUS
  Administered 2021-12-13: 11 [IU] via SUBCUTANEOUS
  Administered 2021-12-13: 4 [IU] via SUBCUTANEOUS
  Administered 2021-12-13: 11 [IU] via SUBCUTANEOUS

## 2021-12-12 MED ORDER — ACETAMINOPHEN 500 MG PO TABS
1000.0000 mg | ORAL_TABLET | Freq: Once | ORAL | Status: AC
  Administered 2021-12-12: 1000 mg via ORAL
  Filled 2021-12-12: qty 2

## 2021-12-12 MED ORDER — INSULIN ASPART 100 UNIT/ML IJ SOLN: 0.0000 [IU] | Freq: Every day | INTRAMUSCULAR | Status: DC

## 2021-12-12 NOTE — Progress Notes (Signed)
?   12/12/21 1400  ?Assess: MEWS Score  ?BP 130/82  ?Pulse Rate (!) 118 ?(MD notified)  ?ECG Heart Rate (!) 118  ?Resp (!) 30  ?Level of Consciousness Alert  ?SpO2 92 %  ?O2 Device Nasal Cannula  ?O2 Flow Rate (L/min) 2 L/min  ?Assess: MEWS Score  ?MEWS Temp 0  ?MEWS Systolic 0  ?MEWS Pulse 2  ?MEWS RR 2  ?MEWS LOC 0  ?MEWS Score 4  ?MEWS Score Color Red  ?Assess: if the MEWS score is Yellow or Red  ?Were vital signs taken at a resting state? Yes  ?Focused Assessment Change from prior assessment (see assessment flowsheet) ?(tachypneic, SOB)  ?Early Detection of Sepsis Score *See Row Information* Low  ?MEWS guidelines implemented *See Row Information* Yes  ?Take Vital Signs  ?Increase Vital Sign Frequency  Red: Q 1hr X 4 then Q 4hr X 4, if remains red, continue Q 4hrs  ?Escalate  ?MEWS: Escalate Red: discuss with charge nurse/RN and provider, consider discussing with RRT  ?Notify: Charge Nurse/RN  ?Name of Charge Nurse/RN Notified Erin, RN  ?Date Charge Nurse/RN Notified 12/12/21  ?Time Charge Nurse/RN Notified 1400  ?Notify: Provider  ?Provider Name/Title Dr.Atway  ?Date Provider Notified 12/12/21  ?Time Provider Notified 1400  ?Notification Type  ?(Secure chat)  ?Notification Reason Change in status  ?Provider response At bedside  ?Date of Provider Response 12/12/21  ?Time of Provider Response 1500 ?(Dr. Coy Saunas assessed patient, SOB resolved, plan is to continue monitoring and notify for increasing SOB.)  ? ? ?

## 2021-12-12 NOTE — Progress Notes (Signed)
Received page regarding Sherry Morris becoming tachycardic, and desatting during sleep. The nurse reported a fever of 103 F. Nurse reported patient being covered in multiple blankets per the nurse but repeat temperature still elevated. Patient seen at bedside and was placed on 1 L [place] with improvement in pulse ox and in her tachycardia. She stated she was comfortable but warm. Denied any acute symptoms with no chest pain, abdominal pain, headache, back pain, or muscle pain. Lab work was ordered to see if patient had an infection as that was suspected earlier but no source identified. CBC, BMP, and blood cultures ordered. Patient was given tylenol and that abated the fever to a low grade fever. Plan was to continue to monitor her temperature and if she spikes a fever again, then start empiric antibiotics.  ?

## 2021-12-12 NOTE — Progress Notes (Signed)
? ?HD#2 ?SUBJECTIVE:  ?Patient Summary: Sherry Morris is a 41 y.o. with a pertinent PMH of diabetes, obesity, hidradenitis suppurativa, partial bilateral hearing loss, and depression, who presented with fatigue and admitted for DKA.  ? ?Overnight Events: Overnight, patient spiked a fever of 103.1 and was tachycardic to the 100-110s. Respiratory rate also had been elevated in the 20-30s, and patient was placed on nasal cannula. Blood cultures x 2 were collected. Low suspicion for urinary source of infection given negative urinalysis findings yesterday.  ? ?Interim History: This is hospital day 2 for this patient who was seen and evaluated at the bedside this morning. She states that she felt really hot overnight and could not get comfortable. She did have some abdominal cramping that improved with pain meds and a heating pad. She denies any chest pain, abd pain, n/v/d, dysuria, hematuria, vaginal discharge/pain. She does note that she had been having vaginal bleeding for about a week- she does not get regular menstrual cycles because of her IUD and this is longer than she would typically bleed for.  ? ?OBJECTIVE:  ?Vital Signs: ?Vitals:  ? 12/12/21 0200 12/12/21 0300 12/12/21 0400 12/12/21 0500  ?BP: (!) 146/84 130/75 112/62 118/77  ?Pulse: (!) 122 (!) 121 (!) 118 (!) 105  ?Resp: (!) 32 (!) 24 (!) 22 (!) 53  ?Temp: (!) 103.1 ?F (39.5 ?C) (!) 102.7 ?F (39.3 ?C) (!) 100.6 ?F (38.1 ?C) 100.1 ?F (37.8 ?C)  ?TempSrc: Oral Oral Oral Oral  ?SpO2: 93% 98% 97% 97%  ?Weight:      ?Height:      ? ?Supplemental O2: Nasal Cannula ?SpO2: 97 % ?O2 Flow Rate (L/min): 2 L/min ? ?Filed Weights  ? 12/10/21 1734 12/11/21 0356  ?Weight: (!) 140.2 kg 135.5 kg  ? ? ? ?Intake/Output Summary (Last 24 hours) at 12/12/2021 9373 ?Last data filed at 12/12/2021 0030 ?Gross per 24 hour  ?Intake 1108.44 ml  ?Output --  ?Net 1108.44 ml  ? ?Net IO Since Admission: 1,108.44 mL [12/12/21 0628] ? ?Physical Exam: ?General: Pleasant, obese female laying  in bed. No acute distress. ?CV: RRR. No murmurs, rubs, or gallops. No LE edema ?Pulmonary: Lungs CTAB. Normal effort. No wheezing or rales. ?Abdominal: Soft, nontender, nondistended obese abdomen. Normal bowel sounds. ?Extremities: Palpable radial and DP pulses. Normal ROM. ?Skin: Right axilla with small lesion consistent with HS, but non-draining- there is a small tract noted.  ?GU: Normal external genitalia. No vaginal discharge or bleeding noted.  ?Neuro: A&Ox3. No focal deficit. ?Psych: Normal mood and affect ? ? ? ?ASSESSMENT/PLAN:  ?Assessment: ?Principal Problem: ?  Diabetic ketoacidosis ([place]) ? ? ?Plan: ?#DKA, resolved ?#Type 1 vs type 2 diabetes ?Anion gap closed x 2 yesterday and patient was transitioned off of Endotool and onto subcutaneous semglee 13u. She has been tolerating a diet and CBGs have been in the 200s throughout the day thus far. She met with the diabetes coordinator who gave the patient a free sample of the Freestyle Libre 2 CGM and educated her about this. At discharge, patient will be sent home with [facility] with the goals of titrating her off of this and optimizing her diabetes regimen in the outpatient setting.  ?- Increase Semglee to 16u qhs  ?- SSI three times daily with meals  ? ?#Fever, unknown origin ?Patient became febrile to 103.1 overnight, tachycardic to the 100-110s, and had an increased RR to the 20-30s. Patient had some abdominal cramping, but otherwise denied any cough, shortness of breath, chest pain,  abd pain, n/v/d, dysuria, vaginal pain, or vaginal discharge. No antibiotics were started as we are unsure if there is a source/infection. The patient notes that she was diagnosed with a yeast infection recently by her PCP and completed treatment for this. Patient was afebrile by the time we evaluated her this morning and the etiology of her fever remains unclear at this time. If she develops chest pain or shortness of breath, would consider CTA to rule out PE, but at this  time, her RR has improved and she is saturating well on room air without any chest pain or SOB. If she develops abdominal pain, would consider CT abd/pelvis, as she could have underlying PID. Patient does have a history of thyroid issues, although she notes that she is not sure what happened exactly but she did have part of her thyroid removed. Will check thyroid studies.  ?- Blood cultures pending ?- Urine culture pending ?- Gonorrhea/chlamydia swab in process  ?- TSH, T4/T3 in process ?- Consider CT angio or CT abd/pelvis as noted above ? ?#Hidradenitis suppurativa ?Patient has a hx of hidradenitis suppurativa. She has one non-draining lesion in her right axilla with a small sinus tract noted. But I do not suspect that this is the etiology for the patient's 103.1 degree fever. ? ? ?Best Practice: ?Diet: Diabetic diet ?IVF: Fluids: none ?VTE: rivaroxaban (XARELTO) tablet 10 mg Start: 12/11/21 1000 ?Code: Full ?AB: None ?DISPO: Anticipated discharge in 1-2 days to Home pending Medical stability. ? ?Signature: ?Jamorris Ndiaye, D.O.  ?Internal Medicine Resident, PGY-1 ?[facility]  ?Pager: [phone] ?6:28 AM, 12/12/2021  ? ?Please contact the on call pager after 5 pm and on weekends at [phone]. ? ?

## 2021-12-12 NOTE — Plan of Care (Signed)
Pt has had hourly VS and accu checks this shift. Insulin drip dc'd at 2330. Received a TV Dinner around 2200. Pt's MEWS scores have been RED and Yellow most of the shift r/t temp, high HR, and RR. Oxygen 1 L/M  started for low sats and to see if it would help bring down her HR. Pt would benefit from a Bariatric bed if she is going to be here past Monday. Bld cx done x2 for temp of 103.1. Pt given 1000 mg of Tylenol. Constant contact with Dr. Chancy Milroy IM doctor on call. Pt independent in care and amb.  ?

## 2021-12-13 DIAGNOSIS — E1310 Other specified diabetes mellitus with ketoacidosis without coma: Secondary | ICD-10-CM | POA: Diagnosis not present

## 2021-12-13 LAB — CBC
HCT: 33 % — ABNORMAL LOW (ref 36.0–46.0)
Hemoglobin: 10.3 g/dL — ABNORMAL LOW (ref 12.0–15.0)
MCH: 23.9 pg — ABNORMAL LOW (ref 26.0–34.0)
MCHC: 31.2 g/dL (ref 30.0–36.0)
MCV: 76.6 fL — ABNORMAL LOW (ref 80.0–100.0)
Platelets: 239 10*3/uL (ref 150–400)
RBC: 4.31 MIL/uL (ref 3.87–5.11)
RDW: 14.8 % (ref 11.5–15.5)
WBC: 6.7 10*3/uL (ref 4.0–10.5)
nRBC: 0 % (ref 0.0–0.2)

## 2021-12-13 LAB — BASIC METABOLIC PANEL
Anion gap: 10 (ref 5–15)
BUN: 5 mg/dL — ABNORMAL LOW (ref 6–20)
CO2: 24 mmol/L (ref 22–32)
Calcium: 8.3 mg/dL — ABNORMAL LOW (ref 8.9–10.3)
Chloride: 100 mmol/L (ref 98–111)
Creatinine, Ser: 0.63 mg/dL (ref 0.44–1.00)
GFR, Estimated: >60 mL/min (ref >60)
Glucose, Bld: 229 mg/dL — ABNORMAL HIGH (ref 70–99)
Potassium: 3.1 mmol/L — ABNORMAL LOW (ref 3.5–5.1)
Sodium: 134 mmol/L — ABNORMAL LOW (ref 135–145)

## 2021-12-13 LAB — RPR: RPR Ser Ql: NONREACTIVE

## 2021-12-13 LAB — T3: T3, Total: 82 ng/dL (ref 71–180)

## 2021-12-13 LAB — GLUCOSE, CAPILLARY
Glucose-Capillary: 186 mg/dL — ABNORMAL HIGH (ref 70–99)
Glucose-Capillary: 276 mg/dL — ABNORMAL HIGH (ref 70–99)
Glucose-Capillary: 260 mg/dL — ABNORMAL HIGH (ref 70–99)
Glucose-Capillary: 239 mg/dL — ABNORMAL HIGH (ref 70–99)

## 2021-12-13 MED ORDER — ORAL CARE MOUTH RINSE
15.0000 mL | Freq: Two times a day (BID) | OROMUCOSAL | Status: DC
  Administered 2021-12-13 – 2021-12-15 (×3): 15 mL via OROMUCOSAL

## 2021-12-13 MED ORDER — INSULIN GLARGINE-YFGN 100 UNIT/ML ~~LOC~~ SOLN
20.0000 [IU] | SUBCUTANEOUS | Status: DC
  Administered 2021-12-13: 20 [IU] via SUBCUTANEOUS
  Filled 2021-12-13 (×2): qty 0.2

## 2021-12-13 MED ORDER — INSULIN ASPART 100 UNIT/ML IJ SOLN
0.0000 [IU] | Freq: Every day | INTRAMUSCULAR | Status: DC
  Administered 2021-12-13: 2 [IU] via SUBCUTANEOUS
  Administered 2021-12-14: 3 [IU] via SUBCUTANEOUS

## 2021-12-13 MED ORDER — SODIUM CHLORIDE 0.9 % IV SOLN: INTRAVENOUS | Status: AC

## 2021-12-13 MED ORDER — INSULIN ASPART 100 UNIT/ML IJ SOLN
0.0000 [IU] | Freq: Three times a day (TID) | INTRAMUSCULAR | Status: DC
  Administered 2021-12-14 (×2): 7 [IU] via SUBCUTANEOUS
  Administered 2021-12-14: 15 [IU] via SUBCUTANEOUS
  Administered 2021-12-15: 7 [IU] via SUBCUTANEOUS
  Administered 2021-12-15: 11 [IU] via SUBCUTANEOUS

## 2021-12-13 MED ORDER — SODIUM CHLORIDE 0.9 % IV BOLUS
1000.0000 mL | Freq: Once | INTRAVENOUS | Status: AC
  Administered 2021-12-13: 1000 mL via INTRAVENOUS

## 2021-12-13 NOTE — Progress Notes (Signed)
Inpatient Diabetes Program Recommendations ? ?AACE/ADA: New Consensus Statement on Inpatient Glycemic Control (2015) ? ?Target Ranges:  Prepandial:   less than 140 mg/dL ?     Peak postprandial:   less than 180 mg/dL (1-2 hours) ?     Critically ill patients:  140 - 180 mg/dL  ? ?Lab Results  ?Component Value Date  ? GLUCAP 276 (H) 12/13/2021  ? HGBA1C 11.9 (H) 12/11/2021  ? ? ?Review of Glycemic Control ? Latest Reference Range & Units 12/12/21 07:39 12/12/21 11:50 12/12/21 16:01 12/12/21 20:44 12/13/21 07:58  ?Glucose-Capillary 70 - 99 mg/dL 200 (H) 244 (H) 191 (H) 210 (H) 186 (H)  ?(H): Data is abnormally high ?Admit with: DKA ?Type 1 Diabetes versus Type 2 Diabetes ?  ?History: DM, Hidradenitis Suppurativa ?  ?Home DM Meds: None listed ?  ?Current Orders:Semglee 20 units , Novolog 0-20 units tid correction ? ?Inpatient Diabetes Program Recommendations:   ?-Add Novolog 3 units tid meal coverage if eats 50% ?-Decrease Novolog correction to 0-15 units tid + hs 0-5 units ? ?Thank you, ?Nani Gasser Chelsae Zanella, RN, MSN, CDE  ?Diabetes Coordinator ?Inpatient Glycemic Control Team ?Team Pager [phone] (8am-5pm) ?12/13/2021 1:13 PM ? ? ? ? ?

## 2021-12-13 NOTE — Plan of Care (Signed)
Pt is independent in care. VSS. No distress this shift. Has had oxygen on and off all shift. ?

## 2021-12-13 NOTE — Progress Notes (Signed)
? ?Subjective:  ? ?Hospital day: 3 ? ?Overnight event: No acute events overnight ? ?Interim History: Patient unable to be seen in the a.m. due to the patient being in the restroom. Patient reevaluated in the afternoon. She denies any fevers overnight. She does endorse some dizziness upon standing this morning and had to sit down in the restroom. She also reports occasionally breathing hard.  She continues to have polydipsia and polyuria.  She denies any palpitations, chest pains or abdominal pain. ? ?Objective: ? ?Vital signs in last 24 hours: ?Vitals:  ? 12/12/21 2100 12/13/21 0011 12/13/21 0400 12/13/21 0756  ?BP: 132/83 123/70 (!) 115/58 129/75  ?Pulse: 96 93 94 92  ?Resp: '20 10 20 16  '$ ?Temp:  98.4 ?F (36.9 ?C) 98.4 ?F (36.9 ?C) 98.4 ?F (36.9 ?C)  ?TempSrc:  Oral Oral Oral  ?SpO2: 94% 100% 100% 99%  ?Weight:      ?Height:      ? ? ?Filed Weights  ? 12/10/21 1734 12/11/21 0356  ?Weight: (!) 140.2 kg 135.5 kg  ? ? ? ?Intake/Output Summary (Last 24 hours) at 12/13/2021 0830 ?Last data filed at 12/13/2021 0100 ?Gross per 24 hour  ?Intake 1960 ml  ?Output --  ?Net 1960 ml  ? ?Net IO Since Admission: 3,068.44 mL [12/13/21 0830] ? ?Recent Labs  ?  12/12/21 ?1601 12/12/21 ?2044 12/13/21 ?0758  ?GLUCAP 191* 210* 186*  ?  ? ?Pertinent Labs: ?CBC Latest Ref Rng & Units 12/13/2021 12/12/2021 12/11/2021  ?WBC 4.0 - 10.5 K/uL 6.7 7.5 8.1  ?Hemoglobin 12.0 - 15.0 g/dL 10.3(L) 11.4(L) 11.2(L)  ?Hematocrit 36.0 - 46.0 % 33.0(L) 34.9(L) 34.4(L)  ?Platelets 150 - 400 K/uL 239 198 209  ? ? ?CMP Latest Ref Rng & Units 12/13/2021 12/12/2021 12/11/2021  ?Glucose 70 - 99 mg/dL 229(H) 250(H) 174(H)  ?BUN 6 - 20 mg/dL 5(L) 6 7  ?Creatinine 0.44 - 1.00 mg/dL 0.63 0.74 0.71  ?Sodium 135 - 145 mmol/L 134(L) 134(L) 136  ?Potassium 3.5 - 5.1 mmol/L 3.1(L) 4.0 3.1(L)  ?Chloride 98 - 111 mmol/L 100 102 100  ?CO2 22 - 32 mmol/L 24 20(L) 25  ?Calcium 8.9 - 10.3 mg/dL 8.3(L) 8.5(L) 8.5(L)  ?Total Protein 6.5 - 8.1 g/dL - - -  ?Total Bilirubin 0.3 - 1.2  mg/dL - - -  ?Alkaline Phos 38 - 126 U/L - - -  ?AST 15 - 41 U/L - - -  ?ALT 0 - 44 U/L - - -  ? ? ?Imaging: ?DG CHEST PORT 1 VIEW ? ?Result Date: 12/12/2021 ?CLINICAL DATA:  Hyperglycemia. EXAM: PORTABLE CHEST 1 VIEW COMPARISON:  12/10/2021 FINDINGS: Mildly decreased lung volumes. Cardiac silhouette and mediastinal contours are within normal limits. The lungs are clear. No pleural effusion or pneumothorax. No acute skeletal abnormality. IMPRESSION: No active disease. Electronically Signed   By: Yvonne Kendall M.D.   On: 12/12/2021 13:38   ? ?Physical Exam ? ?General: Pleasant, obese middle aged woman sitting in bed.  NAD. ?CV: Tachycardic. Regular rhythm. No m/r/g. No LE edema ?Pulmonary: Lungs CTAB. Normal effort. No wheezing or rales. ?Abdominal: Soft, nontender, nondistended. Normal bowel sounds. ?Extremities: Radial and DP pulses 2+ and symmetric. ?Skin: Warm and dry. No obvious rash or lesions. ?Neuro: A&Ox3.  Normal sensation to gross touch. ?Psych: Normal mood and affect ? ?Assessment/Plan: ?EVADEAN SPROULE is a 41 y.o. female with hx of diabetes, obesity, hidradenitis suppurativa, partial bilateral hearing loss, and depression, who presented with fatigue and admitted for DKA.  ? ?Principal  Problem: ?  Diabetic ketoacidosis ([place]) ?Active Problems: ?  Hyperglycemia ?  Fever, unknown origin ? ?#DKA, resolved ?#Type 1 vs type 2 diabetes ?Blood sugar remains elevated in the 180s to 270s in the last 24 hours. Patient still hesitant about insulin injection. Ongoing diabetes education from diabetic coordinator medical team. CGM has been applied by diabetic educator.  Patient reports some lightheadedness this morning when she stood up.  She also endorse polydipsia and polyuria. Patient likely dehydrated in the setting of recent DKA.  BMP showed potassium of 3.1 but normal bicarb and anion gap. ?--IVNS 125 mL x8hr ?--Check othostatics ?--Increase Semglee to 20 units at bedtime ?--SSI TID w/ meals and night time  coverage ?--CBG monitoring ?--CGM sensor needs to be placed prior to discharge ?  ?#Fever, unknown origin ?Patient became febrile to 103.1 2 nights ago with tachycardic to the 100-110s, and had an increased RR to the 20-30s yesterday. Patient has been afebrile the last 24 hours.  No leukocytosis.  Heart rate has improved to the 80s and 90s this morning but slightly uptrending to the 120s when patient stands up. Respiration has also improved.  Infectious work up has not revealed etiology of her fevers at this point. RPR negative. CXR unremarkable.  Blood cultures no growth in the 24 hours.  We will continue to monitor closely. ?- Bcx NGTD ?- Urine culture pending ?- Gonorrhea/chlamydia swab in process  ?--Monitor CBC, fever curve ?  ?#Hidradenitis suppurativa ?Patient has a hx of hidradenitis suppurativa. She has one non-draining lesion in her right axilla with a small  ?--CTM ? ?#Hypothyroidism ?Patient with a history of hypothyroidism not on thyroid replacing med. TSH elevated to 5.79 and free T4 mildly elevated to 1.36. Thyroid studies likely affected by acute illness. Patient will need repeat thyroid studies in the outpatient by PCP.  ?--Needs repeat studies in the outpatient.  ?--Patient will eventually need thyroid replacement ? ?#Hypokalemia ?Potassium down to 3.1 today. ?--KCl 40 mEq x 2 dose ? ?Diet: CM ?IVF: None ?VTE: Xarelto ?CODE: Full ? ?Prior to Admission Living Arrangement: Home ?Anticipated Discharge Location: Home ?Barriers to Discharge: Medical stability  ?Dispo: Anticipated discharge in approximately 1-2 day(s).  ? ?Signed: ?Lacinda Axon, MD ?12/13/2021, 8:30 AM  ?Pager: [phone] ?Internal Medicine Teaching Service ?After 5pm on weekdays and 1pm on weekends: On Call pager: [phone]  ?

## 2021-12-13 NOTE — Progress Notes (Signed)
Patient was a RED MEWS during dayshift for fevers. Providers are aware. PRN Tylenol given.  ? ? 12/13/21 2100  ?Assess: MEWS Score  ?BP 128/73  ?Pulse Rate (!) 115  ?ECG Heart Rate (!) 115  ?Resp (!) 22  ?Level of Consciousness Alert  ?SpO2 93 %  ?O2 Device Room Air  ?Assess: MEWS Score  ?MEWS Temp 0  ?MEWS Systolic 0  ?MEWS Pulse 2  ?MEWS RR 1  ?MEWS LOC 0  ?MEWS Score 3  ?MEWS Score Color Yellow  ?Assess: if the MEWS score is Yellow or Red  ?Were vital signs taken at a resting state? Yes  ?Focused Assessment No change from prior assessment  ?Early Detection of Sepsis Score *See Row Information* Low  ?MEWS guidelines implemented *See Row Information* Yes  ?Treat  ?MEWS Interventions Administered prn meds/treatments ?(PRN tylenol given)  ?Pain Scale 0-10  ?Pain Score 0  ?Take Vital Signs  ?Increase Vital Sign Frequency  Yellow: Q 2hr X 2 then Q 4hr X 2, if remains yellow, continue Q 4hrs  ?Escalate  ?MEWS: Escalate Yellow: discuss with charge nurse/RN and consider discussing with provider and RRT  ?Notify: Charge Nurse/RN  ?Name of Charge Nurse/RN Notified [place] RN  ?Date Charge Nurse/RN Notified 12/13/21  ?Time Charge Nurse/RN Notified 2100  ?Notify: Provider  ?Provider Name/Title Primary Provider aware ?(fevers are known hospital problem for this admission)  ?Notification Reason Other (Comment) ?(Red/Yellow MEWS)  ?Provider response No new orders  ?Document  ?Patient Outcome Other (Comment) ?(awaiting PRN results-remains stable)  ?Progress note created (see row info) Yes  ? ? ?

## 2021-12-13 NOTE — Care Management (Signed)
?  Transition of Care (TOC) Screening Note ? ? ?Patient Details  ?Name: Sherry Morris ?Date of Birth: 09/16/1981 ? ? ?Transition of Care (TOC) CM/SW Contact:    ?Carles Collet, RN ?Phone Number: ?12/13/2021, 4:55 PM ? ? ? ?Transition of Care Department [facility]) has reviewed patient and we will continue to monitor patient advancement through interdisciplinary progression rounds.   ?

## 2021-12-14 ENCOUNTER — Inpatient Hospital Stay (HOSPITAL_COMMUNITY): Payer: BLUE CROSS/BLUE SHIELD

## 2021-12-14 DIAGNOSIS — J189 Pneumonia, unspecified organism: Secondary | ICD-10-CM

## 2021-12-14 DIAGNOSIS — E039 Hypothyroidism, unspecified: Secondary | ICD-10-CM

## 2021-12-14 LAB — BASIC METABOLIC PANEL
Anion gap: 9 (ref 5–15)
BUN: <5 mg/dL — ABNORMAL LOW (ref 6–20)
CO2: 22 mmol/L (ref 22–32)
Calcium: 8.6 mg/dL — ABNORMAL LOW (ref 8.9–10.3)
Chloride: 103 mmol/L (ref 98–111)
Creatinine, Ser: 0.63 mg/dL (ref 0.44–1.00)
GFR, Estimated: >60 mL/min (ref >60)
Glucose, Bld: 263 mg/dL — ABNORMAL HIGH (ref 70–99)
Potassium: 3.9 mmol/L (ref 3.5–5.1)
Sodium: 134 mmol/L — ABNORMAL LOW (ref 135–145)

## 2021-12-14 LAB — ANTI-ISLET CELL ANTIBODY: Pancreatic Islet Cell Antibody: NEGATIVE

## 2021-12-14 LAB — CBC
HCT: 32.5 % — ABNORMAL LOW (ref 36.0–46.0)
Hemoglobin: 10.5 g/dL — ABNORMAL LOW (ref 12.0–15.0)
MCH: 24.6 pg — ABNORMAL LOW (ref 26.0–34.0)
MCHC: 32.3 g/dL (ref 30.0–36.0)
MCV: 76.3 fL — ABNORMAL LOW (ref 80.0–100.0)
Platelets: 221 10*3/uL (ref 150–400)
RBC: 4.26 MIL/uL (ref 3.87–5.11)
RDW: 14.7 % (ref 11.5–15.5)
WBC: 10 10*3/uL (ref 4.0–10.5)
nRBC: 0 % (ref 0.0–0.2)

## 2021-12-14 LAB — GLUCOSE, CAPILLARY
Glucose-Capillary: 211 mg/dL — ABNORMAL HIGH (ref 70–99)
Glucose-Capillary: 350 mg/dL — ABNORMAL HIGH (ref 70–99)
Glucose-Capillary: 236 mg/dL — ABNORMAL HIGH (ref 70–99)
Glucose-Capillary: 303 mg/dL — ABNORMAL HIGH (ref 70–99)
Glucose-Capillary: 298 mg/dL — ABNORMAL HIGH (ref 70–99)

## 2021-12-14 LAB — GC/CHLAMYDIA PROBE AMP (~~LOC~~) NOT AT ARMC
Chlamydia: NEGATIVE
Comment: NEGATIVE
Comment: NORMAL
Neisseria Gonorrhea: NEGATIVE

## 2021-12-14 LAB — SEDIMENTATION RATE: Sed Rate: 76 mm/hr — ABNORMAL HIGH (ref 0–22)

## 2021-12-14 LAB — C-REACTIVE PROTEIN: CRP: 18.3 mg/dL — ABNORMAL HIGH (ref <1.0)

## 2021-12-14 LAB — SAVE SMEAR(SSMR), FOR PROVIDER SLIDE REVIEW

## 2021-12-14 MED ORDER — IOHEXOL 300 MG/ML  SOLN
100.0000 mL | Freq: Once | INTRAMUSCULAR | Status: AC | PRN
  Administered 2021-12-14: 100 mL via INTRAVENOUS

## 2021-12-14 MED ORDER — INSULIN GLARGINE-YFGN 100 UNIT/ML ~~LOC~~ SOLN
25.0000 [IU] | SUBCUTANEOUS | Status: DC
  Administered 2021-12-14: 25 [IU] via SUBCUTANEOUS
  Filled 2021-12-14 (×2): qty 0.25

## 2021-12-14 MED ORDER — INSULIN ASPART 100 UNIT/ML IJ SOLN
5.0000 [IU] | Freq: Three times a day (TID) | INTRAMUSCULAR | Status: DC
  Administered 2021-12-14 (×2): 5 [IU] via SUBCUTANEOUS

## 2021-12-14 MED ORDER — IOHEXOL 9 MG/ML PO SOLN
ORAL | Status: AC
Start: 2021-12-14 — End: 2021-12-14
  Administered 2021-12-14: 500 mL
  Filled 2021-12-14: qty 1000

## 2021-12-14 MED ORDER — ACETAMINOPHEN 500 MG PO TABS
1000.0000 mg | ORAL_TABLET | Freq: Four times a day (QID) | ORAL | Status: DC | PRN
  Administered 2021-12-14 – 2021-12-15 (×3): 1000 mg via ORAL
  Filled 2021-12-14 (×3): qty 2

## 2021-12-14 NOTE — Progress Notes (Signed)
?   12/14/21 0000  ?Assess: MEWS Score  ?Temp (!) 103.1 ?F (39.5 ?C)  ?BP (!) 143/88  ?Pulse Rate (!) 115  ?ECG Heart Rate (!) 114  ?Resp (!) 24  ?Level of Consciousness Alert  ?SpO2 95 %  ?O2 Device Nasal Cannula  ?O2 Flow Rate (L/min) 2 L/min  ?Assess: MEWS Score  ?MEWS Temp 2  ?MEWS Systolic 0  ?MEWS Pulse 2  ?MEWS RR 1  ?MEWS LOC 0  ?MEWS Score 5  ?MEWS Score Color Red  ?Assess: if the MEWS score is Yellow or Red  ?Were vital signs taken at a resting state? Yes  ?Focused Assessment No change from prior assessment  ?Early Detection of Sepsis Score *See Row Information* Low  ?MEWS guidelines implemented *See Row Information* No, previously red, continue vital signs every 4 hours  ?Treat  ?Pain Scale 0-10  ?Pain Score Asleep  ?Take Vital Signs  ?Increase Vital Sign Frequency  Red: Q 1hr X 4 then Q 4hr X 4, if remains red, continue Q 4hrs  ?Escalate  ?MEWS: Escalate Red: discuss with charge nurse/RN and provider, consider discussing with RRT  ?Notify: Charge Nurse/RN  ?Name of Charge Nurse/RN Notified [place] RN  ?Date Charge Nurse/RN Notified 12/14/21  ?Time Charge Nurse/RN Notified 0000  ?Notify: Provider  ?Provider Name/Title Humphrey Rolls ?([facility] Residents)  ?Date Provider Notified 12/14/21  ?Time Provider Notified 0000  ?Notification Type Page  ?Notification Reason Critical result ?(RED MEWS; fever 103.3F)  ?Provider response No new orders  ?Document  ?Patient Outcome Other (Comment) ?(Continue to assess temperatures per MEWS guidelines)  ?Progress note created (see row info) Yes  ? ? ?

## 2021-12-14 NOTE — Progress Notes (Addendum)
? ?HD#4 ?SUBJECTIVE:  ?Patient Summary: Sherry Morris is a 41 y.o. with a pertinent PMH of diabetes, obesity, hidradenitis suppurativa, and depression, who presented with fatigue and admitted for DKA.  ? ?Overnight Events: Overnight patient had another fever to 103.1 but remained asymptomatic. ESR and CRP collected, both of which were elevated. ? ?Interim History: This is hospital day 4 for this patient who was seen and evaluated at the bedside. She states that she is feeling better compared to last night, when she had the fever. During that fever, she said that she felt short of breath but denied any chest pain. She denies any active SOB now, chest pain, abd pain, n/v/d. She does have a new mild cough that is unproductive.  ? ?OBJECTIVE:  ?Vital Signs: ?Vitals:  ? 12/14/21 0000 12/14/21 0200 12/14/21 0300 12/14/21 0400  ?BP: (!) 143/88 116/66 98/61 109/65  ?Pulse: (!) 115 (!) 109 97 95  ?Resp: (!) 24 (!) 22 20 (!) 22  ?Temp: (!) 103.1 ?F (39.5 ?C) 98.2 ?F (36.8 ?C)  98.6 ?F (37 ?C)  ?TempSrc: Oral Oral  Axillary  ?SpO2: 95% 97% 96% 94%  ?Weight:      ?Height:      ? ?Supplemental O2: Nasal Cannula ?SpO2: 94 % ?O2 Flow Rate (L/min): 2 L/min ? ?Filed Weights  ? 12/10/21 1734 12/11/21 0356  ?Weight: (!) 140.2 kg 135.5 kg  ? ? ? ?Intake/Output Summary (Last 24 hours) at 12/14/2021 0752 ?Last data filed at 12/14/2021 0200 ?Gross per 24 hour  ?Intake 2038.32 ml  ?Output 400 ml  ?Net 1638.32 ml  ? ?Net IO Since Admission: 4,706.76 mL [12/14/21 0752] ? ?Physical Exam: ?General: Pleasant, obese female laying in bed. No acute distress. ?CV: RRR. No murmurs, rubs, or gallops. No LE edema ?Pulmonary: Lungs CTAB. Normal work of breathing on room air.  ?Abdominal: Soft, nontender, nondistended obese abdomen. Normal bowel sounds. ?Extremities: Palpable radial and DP pulses. Normal ROM. ?Skin: Right axilla with small lesion consistent with HS, but nondraining although there is a small tract. Rest of skin exam is unremarkable, no  lesions/signs of infection. No lacerations/signs of infection on feet, sensation intact. ?Neuro: A&Ox3. Moves all extremities. Normal sensation. No focal deficit. ?Psych: Normal mood and affect ? ? ? ?ASSESSMENT/PLAN:  ?Assessment: ?Principal Problem: ?  Diabetic ketoacidosis ([place]) ?Active Problems: ?  Hyperglycemia ?  Fever, unknown origin ? ? ?Plan: ?#DKA, resolved ?#Type 1 vs type 2 diabetes ?Patient was transitioned off of the insulin drip on 3/17 in the evening. She was started on subcutaneous insulin and CBGs have not been below 200 yet. In the last 24 hrs, sugars have ranged from 239-263.  ?- C peptide, anti-islet cell Ab, and glutamic acid decarboyxlase Ab in process ?- Increase Semglee to 25u qhs ?- Start Novolog 5u tid with meals ?- SSI tid with meals ?- CGM sensor to be placed prior to discharge ? ?#Fever, unknown origin ?Patient remained afebrile for 24 hrs on 3/18-3/19, however, she spiked another fever overnight to 103.67F. Patient was short of breath during this fever, although otherwise was asymptomatic. She continues to deny any chest pain, palpitations, abd pain, n/v/d, or any urinary/vaginal symptoms. At this time, the cause of her fevers remains unclear. Workup for PID is in process, with gonorrhea/chlamydia swab results pending. RPR was negative. Blood cultures with no growth at 2 days. CBC without leukocytosis. ESR and CRP elevated at 76 and 18.3, respectively. Additionally, the patient denies any recent travel, history of autoimmune conditions in  herself or her family.  ?- CT chest/abd/pelvis pending ?- Gonorrhea/chlamydia swab in process ?- Monitor CBC ?- Monitor fever curve ? ?#Hypothyroidism ?Patient has a history of hypothyroidism, not on any medications. TSH elevated at 5.785, T4 1.36. These results could be confounded by acute illness, thus would recommend repeating int he outpatient setting. ? ?#Hidradenitis suppurativa ?Patient has a hx of hidradenitis suppurativa. She has one  non-draining lesion in her right axilla with a small sinus tract noted- suspect that this is not the cause of her 103F fever.  ? ?#Hypokalemia, improved ?K improved to 3.9 today with oral repletion yesterday. ?- Daily BMP ? ?Best Practice: ?Diet: Diabetic diet ?IVF: Fluids: none ?VTE: rivaroxaban (XARELTO) tablet 10 mg Start: 12/11/21 1000 ?Code: Full ?AB: none ?Therapy Recs: Pending ?DISPO: Anticipated discharge in 1-2 days to Home pending Medical stability. ? ?Signature: ?Treyden Hakim, D.O.  ?Internal Medicine Resident, PGY-1 ?[facility]  ?Pager: [phone] ?7:52 AM, 12/14/2021  ? ?Please contact the on call pager after 5 pm and on weekends at [phone]. ? ?

## 2021-12-14 NOTE — Progress Notes (Signed)
?   12/14/21 0700  ?Assess: MEWS Score  ?BP (!) 97/57  ?Pulse Rate 87  ?ECG Heart Rate 87  ?Resp (!) 22  ?SpO2 98 %  ?Assess: MEWS Score  ?MEWS Temp 0  ?MEWS Systolic 1  ?MEWS Pulse 0  ?MEWS RR 1  ?MEWS LOC 0  ?MEWS Score 2  ?MEWS Score Color Yellow  ?Assess: if the MEWS score is Yellow or Red  ?Were vital signs taken at a resting state? Yes  ?Focused Assessment No change from prior assessment  ?Early Detection of Sepsis Score *See Row Information* Low  ?MEWS guidelines implemented *See Row Information* Yes  ?Treat  ?Pain Scale 0-10  ?Pain Score Asleep  ?Take Vital Signs  ?Increase Vital Sign Frequency  Yellow: Q 2hr X 2 then Q 4hr X 2, if remains yellow, continue Q 4hrs  ?Escalate  ?MEWS: Escalate Yellow: discuss with charge nurse/RN and consider discussing with provider and RRT  ?Notify: Charge Nurse/RN  ?Name of Charge Nurse/RN Notified Erasmo Downer RN  ?Date Charge Nurse/RN Notified 12/14/21  ?Time Charge Nurse/RN Notified 0700  ?Document  ?Patient Outcome Other (Comment) ?(Patient is stable.)  ?Progress note created (see row info) Yes  ? ? ?

## 2021-12-14 NOTE — Evaluation (Signed)
Physical Therapy Evaluation ?Patient Details ?Name: Sherry Morris ?MRN: [id number] ?DOB: 1980/12/29 ?Today's Date: 12/14/2021 ? ?History of Present Illness ? 41 yo female presents to [facility] on 3/16 with DKA. PMH includes diabetes, obesity, hidradenitis suppurativa, and depression.  ?Clinical Impression ? Pt presents with decreased functional mobility, balance, and endurance secondary to diagnosis above. Transfers, short distance gait, and education on continued mobility were performed. The patient tolerated well and accepted education. Pt ambulated 100 feet with no AD and no noted LOB. She was able to reach dynamically out of BOS and return to upright without issue.  Pt. Responded well and has only had a mild decrease in functional ability. SPT recommends no follow up once medically stable for d/c and expects quick return to baseline. PT to sign off given supervision level for mobility. Please read consult if there is any change to functional mobility.     ? ?   ? ?Recommendations for follow up therapy are one component of a multi-disciplinary discharge planning process, led by the attending physician.  Recommendations may be updated based on patient status, additional functional criteria and insurance authorization. ? ?Follow Up Recommendations No PT follow up ? ?  ?Assistance Recommended at Discharge PRN  ?Patient can return home with the following ?   ? ?  ?Equipment Recommendations None recommended by PT  ?Recommendations for Other Services ?    ?  ?Functional Status Assessment Patient has had a recent decline in their functional status and demonstrates the ability to make significant improvements in function in a reasonable and predictable amount of time.  ? ?  ?Precautions / Restrictions Precautions ?Precautions: None ?Restrictions ?Weight Bearing Restrictions: No  ? ?  ? ?Mobility ? Bed Mobility ?Overal bed mobility: Modified Independent ?  ?  ?  ?  ?  ?  ?General bed mobility comments: Increased time,  management of lines ?  ? ?Transfers ?Overall transfer level: Modified independent ?Equipment used: None ?  ?  ?  ?  ?  ?  ?  ?General transfer comment: Pt. with good ability to perform trasnfer and able to come to self steady ?  ? ?Ambulation/Gait ?Ambulation/Gait assistance: Supervision ?Gait Distance (Feet): 100 Feet ?Assistive device: None ?Gait Pattern/deviations: Step-through pattern, Decreased stride length ?Gait velocity: decreased ?  ?Pre-gait activities: Pt. able to perform high marches, with R UE support, to mimic steps ?General Gait Details: General slowed gait, patient reaches out for support with R hand on railing, states she typically reaches for support when walking around ? ?Stairs ?  ?  ?  ?  ?  ? ?Wheelchair Mobility ?  ? ?Modified Rankin (Stroke Patients Only) ?  ? ?  ? ?Balance Overall balance assessment: Modified Independent ?  ?  ?  ?  ?  ?  ?  ?  ?  ?  ?  ?  ?  ?  ?  ?  ?  ?  ?   ? ? ? ?Pertinent Vitals/Pain Pain Assessment ?Pain Assessment: No/denies pain  ? ? ?Home Living Family/patient expects to be discharged to:: Private residence ?Living Arrangements: Children (2 sons) ?Available Help at Discharge: Family ?Type of Home: House (townhome) ?Home Access: Stairs to enter ?  ?Entrance Stairs-Number of Steps: 2-3 ?Alternate Level Stairs-Number of Steps: Flight ?Home Layout: Two level ?Home Equipment: None ?   ?  ?Prior Function Prior Level of Function : Independent/Modified Independent ?  ?  ?  ?  ?  ?  ?Mobility Comments: Works  as a CNA ?ADLs Comments: All independent ?  ? ? ?Hand Dominance  ? Dominant Hand: Right ? ?  ?Extremity/Trunk Assessment  ? Upper Extremity Assessment ?Upper Extremity Assessment: Defer to OT evaluation ?  ? ?Lower Extremity Assessment ?Lower Extremity Assessment: Overall WFL for tasks assessed ?  ? ?Cervical / Trunk Assessment ?Cervical / Trunk Assessment: Normal  ?Communication  ? Communication: No difficulties  ?Cognition Arousal/Alertness: Awake/alert ?Behavior  During Therapy: [facility] for tasks assessed/performed ?Overall Cognitive Status: Within Functional Limits for tasks assessed ?  ?  ?  ?  ?  ?  ?  ?  ?  ?  ?  ?  ?  ?  ?  ?  ?  ?  ?  ? ?  ?General Comments General comments (skin integrity, edema, etc.): Pt. able to reach to floor to pick up towels and return to stand with no noted LOB. ? ?  ?Exercises    ? ?Assessment/Plan  ?  ?PT Assessment Patient does not need any further PT services  ?PT Problem List Decreased strength;Decreased mobility;Decreased activity tolerance;Decreased balance ? ?   ?  ?PT Treatment Interventions     ? ?PT Goals (Current goals can be found in the Care Plan section)  ?Acute Rehab PT Goals ?Patient Stated Goal: To improve ambulation and return to home and work ?PT Goal Formulation: With patient ?Time For Goal Achievement: 12/28/21 ?Potential to Achieve Goals: Good ? ?  ?Frequency   ?  ? ? ?Co-evaluation   ?  ?  ?  ?  ? ? ?  ?AM-PAC PT "6 Clicks" Mobility  ?Outcome Measure Help needed turning from your back to your side while in a flat bed without using bedrails?: None ?Help needed moving from lying on your back to sitting on the side of a flat bed without using bedrails?: None ?Help needed moving to and from a bed to a chair (including a wheelchair)?: None ?Help needed standing up from a chair using your arms (e.g., wheelchair or bedside chair)?: None ?Help needed to walk in hospital room?: None ?Help needed climbing 3-5 steps with a railing? : None ?6 Click Score: 24 ? ?  ?End of Session   ?Activity Tolerance: Patient tolerated treatment well ?Patient left: in bed;with call bell/phone within reach ?Nurse Communication: Mobility status ?PT Visit Diagnosis: Other abnormalities of gait and mobility (R26.89) ?  ? ?Time: 1330-1350 ?PT Time Calculation (min) (ACUTE ONLY): 20 min ? ? ?Charges:   PT Evaluation ?$PT Eval Low Complexity: 1 Low ?  ?  ?   ? ? ?Thermon Leyland, SPT ?Acute Rehab Services ? ? ?Thermon Leyland ?12/14/2021, 2:57 PM ? ?

## 2021-12-15 ENCOUNTER — Inpatient Hospital Stay (HOSPITAL_COMMUNITY): Payer: BLUE CROSS/BLUE SHIELD

## 2021-12-15 ENCOUNTER — Other Ambulatory Visit (HOSPITAL_COMMUNITY): Payer: Self-pay

## 2021-12-15 LAB — BASIC METABOLIC PANEL
Anion gap: 11 (ref 5–15)
BUN: 6 mg/dL (ref 6–20)
CO2: 23 mmol/L (ref 22–32)
Calcium: 8.9 mg/dL (ref 8.9–10.3)
Chloride: 101 mmol/L (ref 98–111)
Creatinine, Ser: 0.52 mg/dL (ref 0.44–1.00)
GFR, Estimated: >60 mL/min (ref >60)
Glucose, Bld: 306 mg/dL — ABNORMAL HIGH (ref 70–99)
Potassium: 4 mmol/L (ref 3.5–5.1)
Sodium: 135 mmol/L (ref 135–145)

## 2021-12-15 LAB — SAVE SMEAR(SSMR), FOR PROVIDER SLIDE REVIEW

## 2021-12-15 LAB — CBC
HCT: 33.8 % — ABNORMAL LOW (ref 36.0–46.0)
Hemoglobin: 10.5 g/dL — ABNORMAL LOW (ref 12.0–15.0)
MCH: 24 pg — ABNORMAL LOW (ref 26.0–34.0)
MCHC: 31.1 g/dL (ref 30.0–36.0)
MCV: 77.2 fL — ABNORMAL LOW (ref 80.0–100.0)
Platelets: 266 10*3/uL (ref 150–400)
RBC: 4.38 MIL/uL (ref 3.87–5.11)
RDW: 14.9 % (ref 11.5–15.5)
WBC: 7.1 10*3/uL (ref 4.0–10.5)
nRBC: 0 % (ref 0.0–0.2)

## 2021-12-15 LAB — GLUCOSE, CAPILLARY
Glucose-Capillary: 289 mg/dL — ABNORMAL HIGH (ref 70–99)
Glucose-Capillary: 258 mg/dL — ABNORMAL HIGH (ref 70–99)
Glucose-Capillary: 248 mg/dL — ABNORMAL HIGH (ref 70–99)

## 2021-12-15 LAB — GLUTAMIC ACID DECARBOXYLASE AUTO ABS: Glutamic Acid Decarb Ab: <5 U/mL (ref 0.0–5.0)

## 2021-12-15 MED ORDER — INSULIN PEN NEEDLE 32G X 4 MM MISC
0 refills | Status: DC
  Filled 2021-12-15: qty 100, 25d supply, fill #0

## 2021-12-15 MED ORDER — INSULIN ASPART 100 UNIT/ML IJ SOLN
10.0000 [IU] | Freq: Three times a day (TID) | INTRAMUSCULAR | Status: DC
  Administered 2021-12-15 (×2): 10 [IU] via SUBCUTANEOUS

## 2021-12-15 MED ORDER — INSULIN GLARGINE-YFGN 100 UNIT/ML ~~LOC~~ SOLN
35.0000 [IU] | SUBCUTANEOUS | Status: DC
  Filled 2021-12-15: qty 0.35

## 2021-12-15 MED ORDER — AMOXICILLIN-POT CLAVULANATE 875-125 MG PO TABS
1.0000 | ORAL_TABLET | Freq: Two times a day (BID) | ORAL | 0 refills | Status: AC
  Filled 2021-12-15: qty 10, 5d supply, fill #0

## 2021-12-15 MED ORDER — AZITHROMYCIN 500 MG PO TABS
500.0000 mg | ORAL_TABLET | Freq: Every day | ORAL | 0 refills | Status: AC
Start: 2021-12-15 — End: 2021-12-18
  Filled 2021-12-15: qty 3, 3d supply, fill #0

## 2021-12-15 MED ORDER — INSULIN GLARGINE-YFGN 100 UNIT/ML ~~LOC~~ SOPN
35.0000 [IU] | PEN_INJECTOR | Freq: Every day | SUBCUTANEOUS | 0 refills | Status: AC
  Filled 2021-12-15: qty 12, 34d supply, fill #0

## 2021-12-15 MED ORDER — INSULIN PEN NEEDLE 32G X 4 MM MISC
0 refills | Status: DC
  Filled 2021-12-15: qty 100, fill #0

## 2021-12-15 NOTE — TOC Benefit Eligibility Note (Signed)
Patient Advocate Encounter ? ?Insurance verification completed.   ? ?The patient is currently admitted and upon discharge could be taking Semglee Flex Pen . ? ?The current 30 day co-pay is, $75.64.  ? ?The patient is currently admitted and upon discharge could be taking Novolog Flex Pen . ? ?The current 30 day co-pay is, $109.64.  ? ?The patient is insured through [employer] and [employer]  ? ? ? ?Lyndel Safe, CPhT ?Pharmacy Patient Advocate Specialist ?[facility] Patient Advocate Team ?Direct Number: [phone]  Fax: [phone] ? ? ? ? ? ?  ?

## 2021-12-15 NOTE — Progress Notes (Signed)
Patient educated on any change in medication and of any follow appointments. IV removed without any c/o pain or complications. Personal items collected and placed in personal items bag. Patient escorted to front via wheelchair. No complaints or concerns stated at this time ?

## 2021-12-15 NOTE — Progress Notes (Addendum)
Inpatient Diabetes Program Recommendations ? ?AACE/ADA: New Consensus Statement on Inpatient Glycemic Control (2015) ? ?Target Ranges:  Prepandial:   less than 140 mg/dL ?     Peak postprandial:   less than 180 mg/dL (1-2 hours) ?     Critically ill patients:  140 - 180 mg/dL  ? ?Lab Results  ?Component Value Date  ? GLUCAP 248 (H) 12/15/2021  ? HGBA1C 11.9 (H) 12/11/2021  ? ? ?Review of Glycemic Control ? Latest Reference Range & Units 12/14/21 11:59 12/14/21 16:27 12/14/21 21:08 12/14/21 22:47 12/15/21 06:00 12/15/21 08:12 12/15/21 12:01  ?Glucose-Capillary 70 - 99 mg/dL 350 (H) 236 (H) 303 (H) 298 (H) 289 (H) 258 (H) 248 (H)  ? ?Diabetes history: DM 2-New diagnosis ? ?Current orders for Inpatient glycemic control:  ?Novolog resistant tid with meals and HS ?Semglee 35 units q 24 hours ?Novolog 10 units tid with meals ? ?Inpatient Diabetes Program Recommendations:   ? ?Note patient to d/c home today.  ?MD ordered application of Freestyle CGM at discharge for patient. Education done regarding application and changing CGM sensor (alternate every 14 days on back of arms), 1 hour warm-up, use of glucometer when alert displays, how to scan CGM for glucose reading and information for PCP. Patient has also been given educational packet regarding use CGM sensor including the 1-800 toll free number for any questions, problems or needs related to the [facility] sensors or reader.    Sensor applied by patient to Left Arm at 1330.  Explained that glucose readings will not be available until 1 hour after application. Reviewed use of CGM including how to scan, changing Sensor, Vitamin C warning, arrows with glucose readings, and Freestyle app.   ? ?Patient appreciative.  Plan is for D/c with basal insulin and close f/u with PCP. Also patient would benefit from f/u for further education regarding DM.  ? ?Thanks,  ?Adah Perl, RN, BC-ADM ?Inpatient Diabetes Coordinator ?Pager [phone]  (8a-5p) ? ? ? ?

## 2021-12-15 NOTE — Plan of Care (Signed)
?  Problem: Education: ?Goal: Knowledge of General Education information will improve ?Description: Including pain rating scale, medication(s)/side effects and non-pharmacologic comfort measures ?Outcome: Progressing ?  ?Problem: Health Behavior/Discharge Planning: ?Goal: Ability to manage health-related needs will improve ?Outcome: Progressing ?  ?Problem: Clinical Measurements: ?Goal: Ability to maintain clinical measurements within normal limits will improve ?Outcome: Progressing ?Goal: Will remain free from infection ?Outcome: Progressing ?Goal: Diagnostic test results will improve ?Outcome: Progressing ?  ?Problem: Activity: ?Goal: Risk for activity intolerance will decrease ?Outcome: Progressing ?  ?Problem: Nutrition: ?Goal: Adequate nutrition will be maintained ?Outcome: Progressing ?  ?Problem: Coping: ?Goal: Level of anxiety will decrease ?Outcome: Progressing ?  ?Problem: Pain Managment: ?Goal: General experience of comfort will improve ?Outcome: Progressing ?  ?Problem: Safety: ?Goal: Ability to remain free from injury will improve ?Outcome: Progressing ?  ?Problem: Education: ?Goal: Ability to describe self-care measures that may prevent or decrease complications (Diabetes Survival Skills Education) will improve ?Outcome: Progressing ?Goal: Individualized Educational Video(s) ?Outcome: Progressing ?  ?Problem: Coping: ?Goal: Ability to adjust to condition or change in health will improve ?Outcome: Progressing ?  ?Problem: Health Behavior/Discharge Planning: ?Goal: Ability to identify and utilize available resources and services will improve ?Outcome: Progressing ?Goal: Ability to manage health-related needs will improve ?Outcome: Progressing ?  ?Problem: Metabolic: ?Goal: Ability to maintain appropriate glucose levels will improve ?Outcome: Progressing ?  ?Problem: Nutritional: ?Goal: Maintenance of adequate nutrition will improve ?Outcome: Progressing ?Goal: Progress toward achieving an optimal weight  will improve ?Outcome: Progressing ?  ?

## 2021-12-15 NOTE — Discharge Summary (Addendum)
? ?Name: Sherry Morris ?MRN: [id number] ?DOB: [birth year] 41 y.o. ?PCP: Sherry Bill, MD ? ?Date of Admission: 12/10/2021  5:23 PM ?Date of Discharge:  12/15/2021 ?Attending Physician: Dr. Angelia Mould ? ?DISCHARGE DIAGNOSIS:  ?Primary Problem: Diabetic ketoacidosis ([place])  ? ?Hospital Problems: ?Principal Problem: ?  Diabetic ketoacidosis ([place]) ?Active Problems: ?  Hyperglycemia ?  Community Acquired Pneumonia  ?  ? ?DISCHARGE MEDICATIONS:  ? ?Allergies as of 12/15/2021   ?No Known Allergies ?  ? ?  ?Medication List  ?  ? ?STOP taking these medications   ? ?cyclobenzaprine 10 MG tablet ?Commonly known as: FLEXERIL ?  ?methocarbamol 500 MG tablet ?Commonly known as: ROBAXIN ?  ? ?  ? ?TAKE these medications   ? ?amoxicillin-clavulanate 875-125 MG tablet ?Commonly known as: Augmentin ?Take 1 tablet by mouth 2 (two) times daily for 5 days. ?  ?azithromycin 500 MG tablet ?Commonly known as: Zithromax ?Take 1 tablet (500 mg total) by mouth daily for 3 days. Take 1 tablet daily for 3 days. ?  ?ibuprofen 200 MG tablet ?Commonly known as: ADVIL ?Take 200 mg by mouth every 6 (six) hours as needed for moderate pain. ?  ?ibuprofen 800 MG tablet ?Commonly known as: ADVIL ?Take 1 tablet (800 mg total) by mouth 3 (three) times daily. ?  ?insulin glargine-yfgn 100 UNIT/ML Pen ?Commonly known as: SEMGLEE ?Inject 35 Units into the skin at bedtime. ?  ?levonorgestrel 20 MCG/24HR IUD ?Commonly known as: MIRENA ?1 each by Intrauterine route once. June 2018 ?  ? ?  ? ? ?DISPOSITION AND FOLLOW-UP:  ?Ms.Sherry Morris was discharged from [facility] in Stable condition. At the hospital follow up visit please address: ? ?Diabetes: Discharged with Semglee 35u qhs and CGM monitor. Would consider starting ER metformin slowly, as well as a GLP-1 if not cost prohibitive.  ?Intermittent fevers: Consider autoimmune workup if fevers are persistent. Empirically treating with 5 days of Augmentin and 3 days of Azithromycin  given ground glass opacities on CT chest.  ?IUD/uterine fibroids: Patient notes she has had her IUD for 10 years. Recommend referral to OBGYN for removal/replacement of this. Also noted to have ovarian cyst/uterine fibroids on pelvic ultrasound.  ? ?Follow-up Recommendations: ?Consults: none ?Labs: Blood Sugar and Hgb A1C ?Studies: none ?Medications: Semglee 35u qhs, azithromycin and augmentin ? ?Follow-up Appointments: ? Follow-up Information   ? ? Sherry Bill, MD. Go on 12/17/2021.   ?Specialty: Family Medicine ?Why: 10:40 am appt ?Contact information: ?[address] ?[address] ?[place] [address] ?[phone] ? ? ?  ?  ? ?  ?  ? ?  ? ? ?HOSPITAL COURSE:  ?Patient Summary: ?#DKA ?#Type 1 vs type 2 diabetes ?Patient presented with fatigue and reported non-adherence with her Sherry Morris secondary to cost issues. Glucose level was 281 on admission with an anion gap of 24, most recently anion gap down to 13. Additionally, beta hydroxybutyric acid level was elevated to 6.78 and urinalysis with 500 glucose, >160 ketones, and 100 protein. Patient was found to be in DKA and was started on IV insulin drip. Of note, A1c 11.9 and patient is unsure if she has type 1 or 2 diabetes. K low-normal at 3.6- will replete as patient is on IV insulin drip. C peptide was within normal limits and anti-islet cell Ab was negative, although glutamic acid decarboxylase Ab in process. Suspect that type II DKA is likely due to diease progression and nonadherence with her medications. Patient was transitioned  off of the insulin drip once anion gap closed x2 and started on subcutaneous semglee 13u, which had increased to 35u qhs by the time of discharge. She met with the diabetes coordinator who gave the patient a free sample of the Freestyle Libre 2 CGM and educated her about this. Patient discharged with Semglee 35u qhs, although will likely need this medication titrated with her outpatient PCP (has appt on 3/23).  Additionally, would recommend trialing the patient on metformin extended release (she has had GI side effects with instant release). She would also benefit from GLP-1 agonist, however, may be cost prohibitive.  ? ?#Fever, Community Acquired Pneumonia ?Patient intermittently became febrile to 103.44F over a couple of nights, although she remained largely asymptomatic during these episodes. She denied any shortness of breath, chest pain, abd pain, n/v/d, dysuria, vaginal pain, or vaginal discharge. She did develop a cough during her hospitalization, although COVID and flu testing were negative. Workup for PID was pursued,given patient recently was treated for yeast infection, however, gonorrhea/chlamdyia and RPR testing were negative. CT chest/abd/pelvis was obtained given this unknown fever and showed some small nodules and ground glass opacities in the right chest that may be infectious vs inflammatory. CT also noted some hepatosplenomegaly with hepatic steatosis and a density in the left adnexa that could be a hemorrhagic cyst vs endometrioma. Also noted to have a fibroid uterus with IUD. Pelvic ultrasound was then obtained which showed uterine fibroids and a 4.5 cm simple cyst in the left adnexa. Given CT findings in the chest/lung, will empirically treat with 5 days of Augmentin and 3 days of Azithromycin. Would consider an outpatient workup for autoimmune conditions if intermittent fevers persist.  ? ?#Hypothyroidism ?Patient has a history of hypothyroidism, not on any medications. TSH elevated at 5.785, T4 1.36. These results could be confounded by acute illness, thus would recommend repeating int he outpatient setting. ? ?#Hidradenitis suppurativa ?Patient has a hx of hidradenitis suppurativa. She has one non-draining lesion in her right axilla with a small sinus tract noted- suspect that this is not the cause of her 103F fever.  ? ?#Hypokalemia, improved ?Patient was hypokalemic to 3.1, improved with one time  oral supplementation.  ?  ? ?DISCHARGE INSTRUCTIONS:  ? ?Discharge Instructions   ? ? Call MD for:  persistant nausea and vomiting   Complete by: As directed ?  ? Call MD for:  severe uncontrolled pain   Complete by: As directed ?  ? Call MD for:  temperature >100.4   Complete by: As directed ?  ? Diet Carb Modified   Complete by: As directed ?  ? Discharge instructions   Complete by: As directed ?  ? Dear Ms. Rolena Infante, ? ?You were hospitalized for diabetic ketoacidosis (sugar levels being so high that your blood became acidic). We treated you with continuous infusions of insulin through the IV, which helped your sugars normalized. You were then started on insulin injections each night, which you will go home with. You should inject 35 units of Semglee each night. The diabetes nurse also provided you with a continuous glucose monitor, so you can see how your sugars are running. It will be very important for you to see your primary care physician about this, so you can be started on other medications for diabetes and hopefully get off of insulin in the future! ? ?Additionally, while you were hospitalized you had some fevers that we were concerned about. We are treating you for a possible pneumonia, so please  take Azithromycin and Augmentin for this. If your cough lingers beyond two weeks, please bring this up to your primary care physician.  ? ?Also, we found that you have uterine fibroids (clumps of tissue on your uterus). You may need to see an OBGYN if you notice your period bleeding gets heavier and also, they could help you replace your IUD, or simply get it removed.  ? ?We are glad you are feeling better!  ? Increase activity slowly   Complete by: As directed ?  ? ?  ? ? ?SUBJECTIVE:  ?Patient states that she is sometimes having a dry cough but overall she is feeling better. Counseled that cough can linger for 2 weeks if she does have a bacterial pneumonia, but if it lingers longer would consider further  evaluation. She is not having any chest pain, abd pain, n/v/d, or urinary symptoms at this time.  ? ?Discharge Vitals:   ?BP 123/90 (BP Location: Right Arm)   Pulse 93   Temp 98.1 ?F (36.7 ?C) (Oral)   Resp 20   Ht 5' 4"  (1

## 2021-12-16 ENCOUNTER — Other Ambulatory Visit (HOSPITAL_COMMUNITY): Payer: Self-pay

## 2021-12-16 LAB — PATHOLOGIST SMEAR REVIEW

## 2021-12-17 LAB — CULTURE, BLOOD (ROUTINE X 2)
Culture: NO GROWTH
Culture: NO GROWTH
Special Requests: ADEQUATE
Special Requests: ADEQUATE

## 2021-12-30 ENCOUNTER — Other Ambulatory Visit: Payer: Self-pay

## 2021-12-30 ENCOUNTER — Encounter (HOSPITAL_BASED_OUTPATIENT_CLINIC_OR_DEPARTMENT_OTHER): Payer: Self-pay | Admitting: Emergency Medicine

## 2021-12-30 DIAGNOSIS — Z794 Long term (current) use of insulin: Secondary | ICD-10-CM | POA: Insufficient documentation

## 2021-12-30 DIAGNOSIS — M79622 Pain in left upper arm: Secondary | ICD-10-CM | POA: Insufficient documentation

## 2021-12-30 NOTE — ED Triage Notes (Signed)
Pain and swelling in lump under L arm x 2weeks. Has h/o HS. No taking abx at this time for her HS. Not currently followed by a dermatologist. ?Denies fever, chills, n/v/d. ?

## 2021-12-31 ENCOUNTER — Emergency Department (HOSPITAL_BASED_OUTPATIENT_CLINIC_OR_DEPARTMENT_OTHER)
Admission: EM | Admit: 2021-12-31 | Discharge: 2021-12-31 | Disposition: A | Discharge status: Home / Self Care | Payer: BLUE CROSS/BLUE SHIELD | Attending: Emergency Medicine | Admitting: Emergency Medicine

## 2021-12-31 ENCOUNTER — Encounter (HOSPITAL_BASED_OUTPATIENT_CLINIC_OR_DEPARTMENT_OTHER): Payer: Self-pay | Admitting: Emergency Medicine

## 2021-12-31 DIAGNOSIS — L732 Hidradenitis suppurativa: Secondary | ICD-10-CM

## 2021-12-31 HISTORY — DX: Hidradenitis suppurativa: L73.2

## 2021-12-31 MED ORDER — NAPROXEN 250 MG PO TABS
500.0000 mg | ORAL_TABLET | Freq: Once | ORAL | Status: AC
  Administered 2021-12-31: 500 mg via ORAL
  Filled 2021-12-31: qty 2

## 2021-12-31 MED ORDER — ACETAMINOPHEN 500 MG PO TABS
1000.0000 mg | ORAL_TABLET | Freq: Once | ORAL | Status: AC
  Administered 2021-12-31: 1000 mg via ORAL
  Filled 2021-12-31: qty 2

## 2021-12-31 MED ORDER — DOXYCYCLINE HYCLATE 100 MG PO CAPS: 100.0000 mg | ORAL_CAPSULE | Freq: Two times a day (BID) | ORAL | 0 refills | Status: DC

## 2021-12-31 MED ORDER — FLUCONAZOLE 150 MG PO TABS: 150.0000 mg | ORAL_TABLET | Freq: Once | ORAL | 0 refills | Status: AC

## 2021-12-31 MED ORDER — DOXYCYCLINE HYCLATE 100 MG PO TABS
100.0000 mg | ORAL_TABLET | Freq: Once | ORAL | Status: AC
  Administered 2021-12-31: 100 mg via ORAL
  Filled 2021-12-31: qty 1

## 2021-12-31 MED ORDER — NAPROXEN 375 MG PO TABS: 375.0000 mg | ORAL_TABLET | Freq: Two times a day (BID) | ORAL | 0 refills | Status: DC

## 2021-12-31 NOTE — ED Provider Notes (Addendum)
?[facility] EMERGENCY DEPT ?Provider Note ? ? ?CSN: [id number] ?Arrival date & time: 12/30/21  2317 ? ?  ? ?History ? ?Chief Complaint  ?Patient presents with  ? Abscess  ? ? ?Sherry Morris is a 41 y.o. female. ? ?The history is provided by the patient.  ?Illness ?Location:  Left axilla ?Quality:  Pain ?Severity:  Moderate ?Onset quality:  Gradual ?Duration:  1 week ?Timing:  Constant ?Progression:  Worsening ?Chronicity:  Recurrent ?Context:  Has hidradenitis, shaved area and is now angry. ?Relieved by:  Nothing ?Worsened by:  Time ?Ineffective treatments:  None ?Associated symptoms: no abdominal pain, no chest pain, no congestion, no fever, no headaches, no shortness of breath and no wheezing   ?Risk factors:  Hidradenitis ?Patient with hidradenitis with inflamed area in left axilla.   ?  ? ?Home Medications ?Prior to Admission medications   ?Medication Sig Start Date End Date Taking? Authorizing Provider  ?ibuprofen (ADVIL) 200 MG tablet Take 200 mg by mouth every 6 (six) hours as needed for moderate pain.    [provider]  ?ibuprofen (ADVIL) 800 MG tablet Take 1 tablet (800 mg total) by mouth 3 (three) times daily. ?Patient not taking: Reported on 12/10/2021 01/08/20   Wieters, Madelynn Done C, PA-C  ?insulin glargine-yfgn (SEMGLEE) 100 UNIT/ML Pen Inject 35 Units into the skin at bedtime. 12/15/21 01/18/22  Dorethea Clan, DO  ?Insulin Pen Needle 32G X 4 MM MISC Use to inject insulin up to 4 times daily as directed. 12/15/21   Dorethea Clan, DO  ?levonorgestrel (MIRENA) 20 MCG/24HR IUD 1 each by Intrauterine route once. June 2018    [provider]  ?   ? ?Allergies    ?Patient has no known allergies.   ? ?Review of Systems   ?Review of Systems  ?Constitutional:  Negative for fever.  ?HENT:  Negative for congestion.   ?Eyes:  Negative for photophobia.  ?Respiratory:  Negative for shortness of breath, wheezing and stridor.   ?Cardiovascular:  Negative for chest pain.  ?Gastrointestinal:   Negative for abdominal pain.  ?Genitourinary:  Negative for dysuria.  ?Musculoskeletal:  Negative for neck pain.  ?Skin:  Negative for wound.  ?Neurological:  Negative for headaches.  ?Psychiatric/Behavioral:  Negative for agitation.   ?All other systems reviewed and are negative. ? ?Physical Exam ?Updated Vital Signs ?BP 104/66 (BP Location: Right Arm)   Pulse 92   Temp 98.7 ?F (37.1 ?C)   Resp 20   Ht 5' 3.78" (1.62 m)   Wt 132.9 kg   SpO2 99%   BMI 50.64 kg/m?  ?Physical Exam ?Vitals and nursing note reviewed.  ?Constitutional:   ?   General: She is not in acute distress. ?   Appearance: Normal appearance.  ?HENT:  ?   Head: Normocephalic and atraumatic.  ?   Nose: Nose normal.  ?Eyes:  ?   Pupils: Pupils are equal, round, and reactive to light.  ?Cardiovascular:  ?   Rate and Rhythm: Normal rate and regular rhythm.  ?   Pulses: Normal pulses.  ?   Heart sounds: Normal heart sounds.  ?Pulmonary:  ?   Effort: Pulmonary effort is normal.  ?   Breath sounds: Normal breath sounds.  ?Abdominal:  ?   General: Bowel sounds are normal.  ?   Palpations: Abdomen is soft.  ?   Tenderness: There is no abdominal tenderness. There is no guarding.  ?Musculoskeletal:  ?   Cervical back: Normal range of motion  and neck supple.  ?Skin: ?   General: Skin is warm and dry.  ?   Capillary Refill: Capillary refill takes less than 2 seconds.  ? ?    ?   Comments: No abscess but inflamed red tissue without fluctuance, no drainage   ?Neurological:  ?   General: No focal deficit present.  ?   Mental Status: She is alert and oriented to person, place, and time.  ?   Deep Tendon Reflexes: Reflexes normal.  ?Psychiatric:     ?   Mood and Affect: Mood normal.     ?   Behavior: Behavior normal.  ? ? ?ED Results / Procedures / Treatments   ?Labs ?(all labs ordered are listed, but only abnormal results are displayed) ?Labs Reviewed - No data to display ? ?EKG ?None ? ?Radiology ?No results found. ? ?Procedures ?Procedures   ? ? ?Medications Ordered in ED ?Medications  ?naproxen (NAPROSYN) tablet 500 mg (has no administration in time range)  ?acetaminophen (TYLENOL) tablet 1,000 mg (has no administration in time range)  ?doxycycline (VIBRA-TABS) tablet 100 mg (has no administration in time range)  ? ? ?ED Course/ Medical Decision Making/ A&P ?  ?                        ?Medical Decision Making ?Hidradenitis with flair in left axilla patient is concerned about yeast infection ? ?Risk ?OTC drugs. ?Prescription drug management. ?Risk Details: No abscess to drain at this time. I explained this at length to the patient.  Advised by nurse patient does not understand this.  EDP went back to patient's room and used bedside ultrasound to demonstrate to the patient that there was no abscess to drain.  I apologized for the patient's frustration at the pain and stated both antibiotics and pain medication was sent to pharmacy and to not shave the area as this likely will make pain and redness worse.  Will start doxycycline and naproxen for pain, have advised warm compresses and close follow up with PMD.  RX for Diflucan post medication provided.  May also use monistat cream in the interim.   ? ? ? ?Final Clinical Impression(s) / ED Diagnoses ?Final diagnoses:  ?None  ? ?Return for intractable cough, coughing up blood, fevers > 100.4 unrelieved by medication, shortness of breath, intractable vomiting, chest pain, shortness of breath, weakness, numbness, changes in speech, facial asymmetry, abdominal pain, passing out, Inability to tolerate liquids or food, cough, altered mental status or any concerns. No signs of systemic illness or infection. The patient is nontoxic-appearing on exam and vital signs are within normal limits.  ?I have reviewed the triage vital signs and the nursing notes. Pertinent labs & imaging results that were available during my care of the patient were reviewed by me and considered in my medical decision making (see chart for  details). After history, exam, and medical workup I feel the patient has been appropriately medically screened and is safe for discharge home. Pertinent diagnoses were discussed with the patient. Patient was given return precautions.  ?Rx / DC Orders ?ED Discharge Orders   ? ? None  ? ?  ? ? ?  ? ?  ?Anamae Rochelle, MD ?12/31/21 0960 ? ?

## 2021-12-31 NOTE — ED Notes (Addendum)
MD Palumbo at bedside per patient's request. MD Palumbo assessed site under patient's arm with ultrasound.  ?

## 2022-07-10 IMAGING — US US PELVIS COMPLETE WITH TRANSVAGINAL
1 series · 12 of 25 positions shown · non-contrast
Comparison: None.
COMPARISON: None.

Addendum:
CLINICAL DATA: Uterine fibroids, ovarian cyst

EXAM:
TRANSABDOMINAL AND TRANSVAGINAL ULTRASOUND OF PELVIS
DOPPLER ULTRASOUND OF OVARIES
TECHNIQUE: Both transabdominal and transvaginal ultrasound examinations of the
pelvis were performed. Transabdominal technique was performed for
global imaging of the pelvis including uterus, ovaries, adnexal
regions, and pelvic cul-de-sac.
It was necessary to proceed with endovaginal exam following the
transabdominal exam to visualize the endometrium and ovaries. Color
and duplex Doppler ultrasound was utilized to evaluate blood flow to
the ovaries.

[Series 1: us pelvic complete with transvaginal · 12 of 122 slices shown]
[im 6/122]
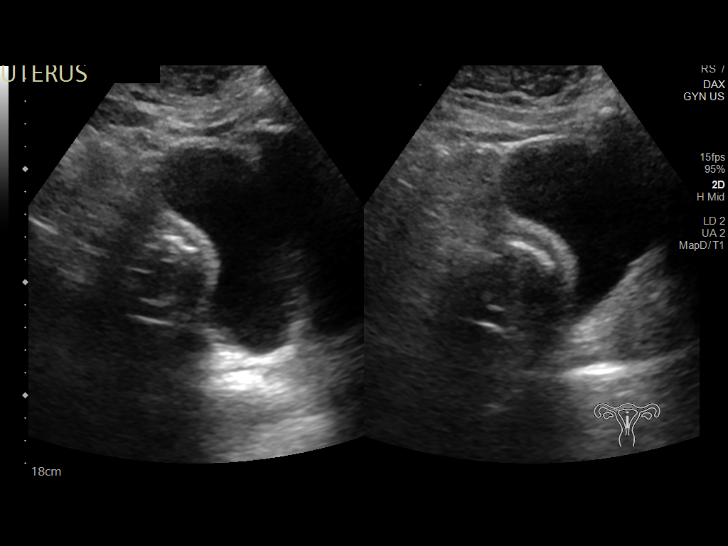
[im 16/122]
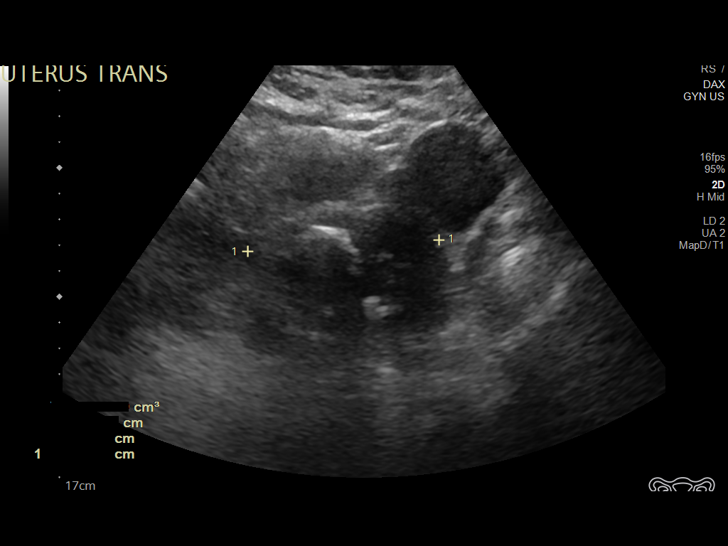
[im 26/122]
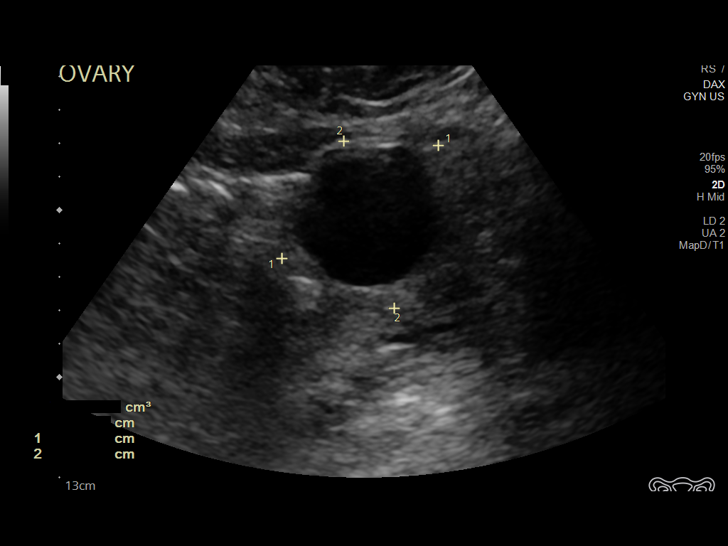
[im 36/122]
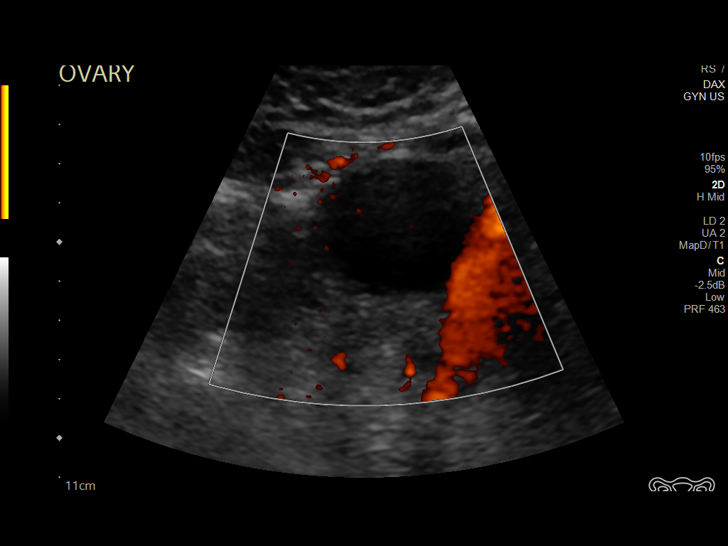
[im 46/122]
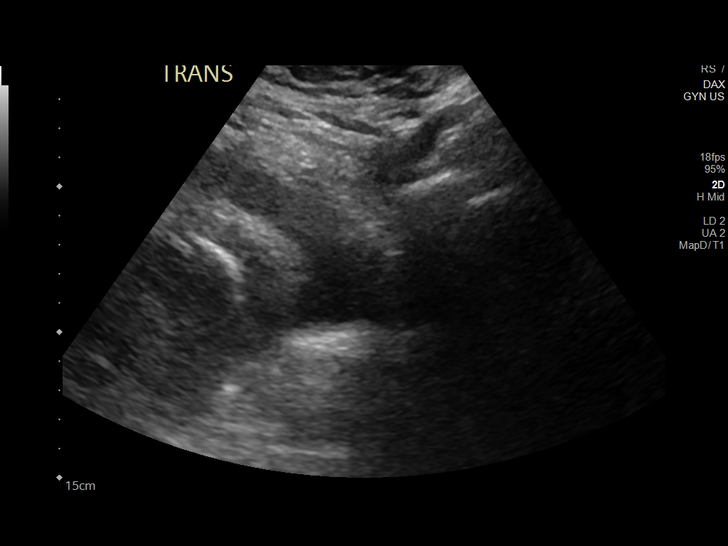
[im 56/122]
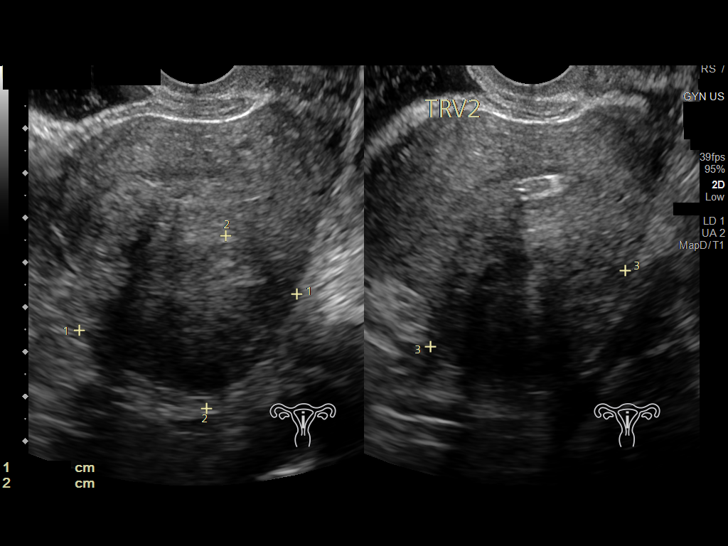
[im 66/122]
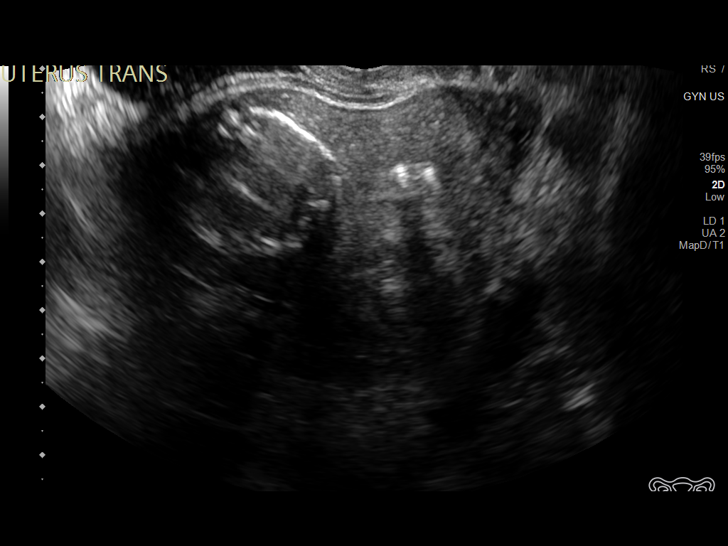
[im 76/122]
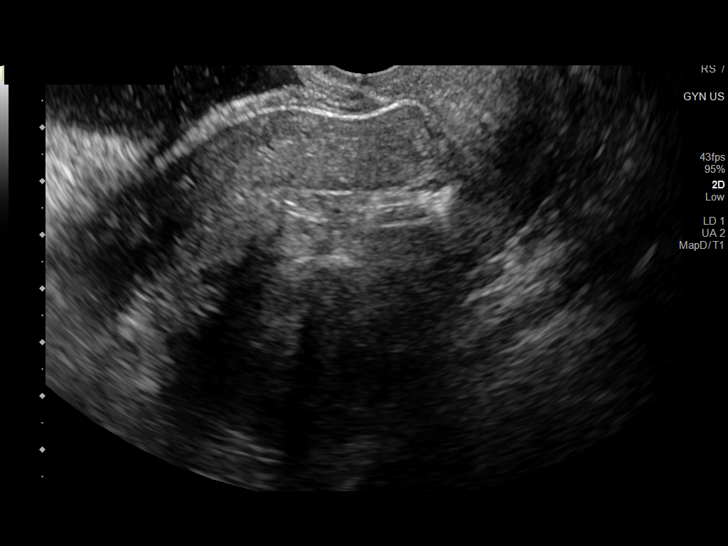
[im 86/122]
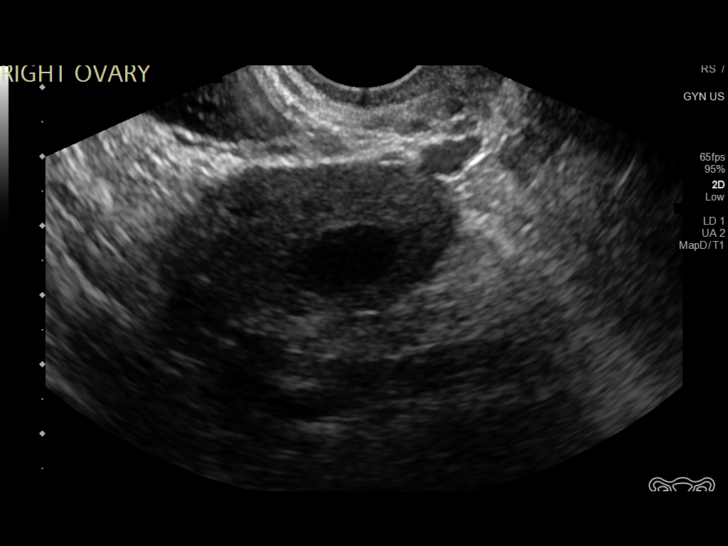
[im 96/122]
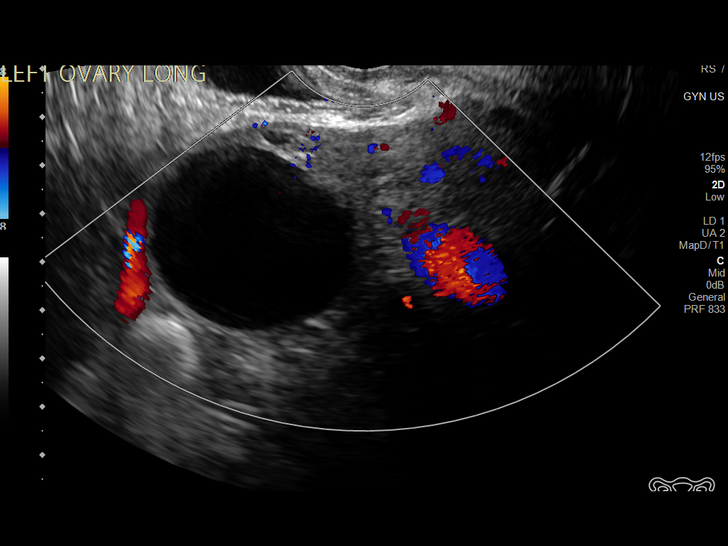
[im 106/122]
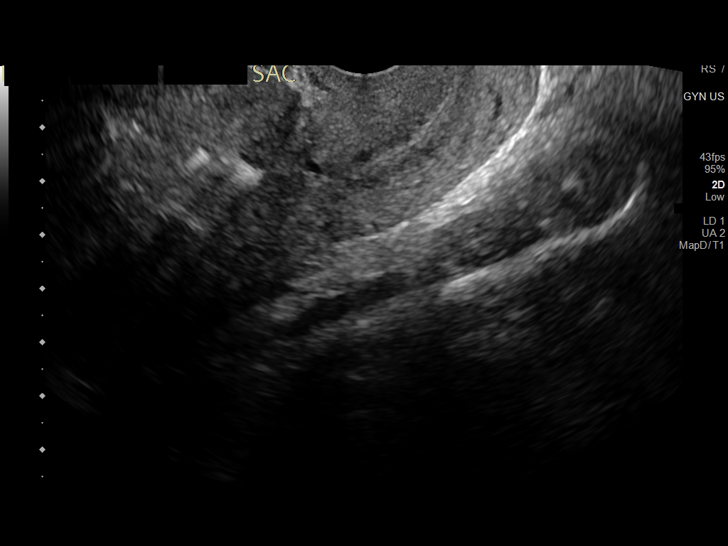
[im 116/122]
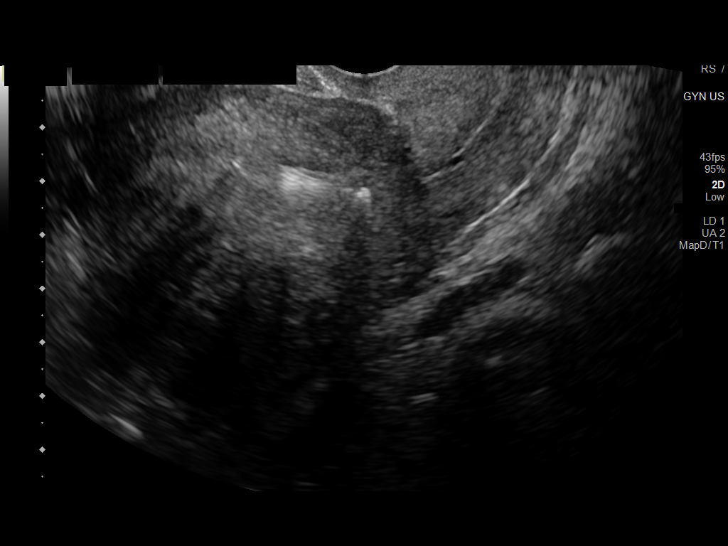

[12 of 25 positions shown; findings below may reference images not displayed]

FINDINGS: Uterus

Measurements: 13 x 7.7 x 7.4 cm = volume: 366 mL. There is
inhomogeneous echogenicity in myometrium. There is 4.1 x 4.1 x
cm fibroid in the anterior fundus. There is 4.9 x 3.9 x 4.7 cm
fibroid with coarse calcifications in the posterior fundus.

Endometrium

Thickness: 7 mm. Linear hyperechoic structure in the endometrium is
consistent with IUD.

Right ovary

Measurements: 3.7 x 2.5 x 2.5 cm = volume: 12 mL. Normal
appearance/no adnexal mass.

Left ovary

Measurements: 5.6 x 4.8 x 4.2 cm = volume: 59.3 mL. There is 4.6 x
3.6 cm cyst without internal septations.

Pulsed Doppler evaluation of both ovaries demonstrates normal
low-resistance arterial and venous waveforms.

Other findings

There is no free fluid in the pelvis. There are low-level echoes in
the the lumen of urinary bladder. This may suggest presence of blood
products or debris related to cystitis.
IMPRESSION: There is inhomogeneous echogenicity in myometrium with uterine
fibroids. IUD is seen in the endometrial cavity. There is 4.6 cm
simple appearing cyst in the left adnexa, possibly functional left
ovarian cyst.

There are scattered tiny echogenic foci in the lumen of the urinary
bladder. Please correlate for possible cystitis.

ADDENDUM:
This addendum is made to clarify Doppler technique used for the
study. Color flow Doppler examination was done. Pulsed Doppler
examination was not performed.

*** End of Addendum ***
FINDINGS: Uterus

Measurements: 13 x 7.7 x 7.4 cm = volume: 366 mL. There is
inhomogeneous echogenicity in myometrium. There is 4.1 x 4.1 x
cm fibroid in the anterior fundus. There is 4.9 x 3.9 x 4.7 cm
fibroid with coarse calcifications in the posterior fundus.

Endometrium

Thickness: 7 mm. Linear hyperechoic structure in the endometrium is
consistent with IUD.

Right ovary

Measurements: 3.7 x 2.5 x 2.5 cm = volume: 12 mL. Normal
appearance/no adnexal mass.

Left ovary

Measurements: 5.6 x 4.8 x 4.2 cm = volume: 59.3 mL. There is 4.6 x
3.6 cm cyst without internal septations.

Pulsed Doppler evaluation of both ovaries demonstrates normal
low-resistance arterial and venous waveforms.

Other findings

There is no free fluid in the pelvis. There are low-level echoes in
the the lumen of urinary bladder. This may suggest presence of blood
products or debris related to cystitis.
IMPRESSION: There is inhomogeneous echogenicity in myometrium with uterine
fibroids. IUD is seen in the endometrial cavity. There is 4.6 cm
simple appearing cyst in the left adnexa, possibly functional left
ovarian cyst.

There are scattered tiny echogenic foci in the lumen of the urinary
bladder. Please correlate for possible cystitis.

## 2023-12-11 ENCOUNTER — Other Ambulatory Visit: Payer: Self-pay

## 2023-12-11 ENCOUNTER — Encounter (HOSPITAL_COMMUNITY): Payer: Self-pay

## 2023-12-11 ENCOUNTER — Emergency Department (HOSPITAL_COMMUNITY)
Admission: EM | Admit: 2023-12-11 | Discharge: 2023-12-11 | Disposition: A | Discharge status: Home / Self Care | Attending: Emergency Medicine | Admitting: Emergency Medicine

## 2023-12-11 DIAGNOSIS — Z794 Long term (current) use of insulin: Secondary | ICD-10-CM | POA: Insufficient documentation

## 2023-12-11 DIAGNOSIS — R739 Hyperglycemia, unspecified: Secondary | ICD-10-CM

## 2023-12-11 DIAGNOSIS — E1165 Type 2 diabetes mellitus with hyperglycemia: Secondary | ICD-10-CM | POA: Insufficient documentation

## 2023-12-11 DIAGNOSIS — B974 Respiratory syncytial virus as the cause of diseases classified elsewhere: Secondary | ICD-10-CM | POA: Diagnosis not present

## 2023-12-11 DIAGNOSIS — R000 Tachycardia, unspecified: Secondary | ICD-10-CM | POA: Insufficient documentation

## 2023-12-11 DIAGNOSIS — J210 Acute bronchiolitis due to respiratory syncytial virus: Secondary | ICD-10-CM

## 2023-12-11 LAB — URINALYSIS, ROUTINE W REFLEX MICROSCOPIC
Bacteria, UA: NONE SEEN
Bilirubin Urine: NEGATIVE
Glucose, UA: 50 mg/dL — AB
Ketones, ur: NEGATIVE mg/dL
Leukocytes,Ua: NEGATIVE
Nitrite: NEGATIVE
Protein, ur: NEGATIVE mg/dL
Specific Gravity, Urine: 1.016 (ref 1.005–1.030)
pH: 5 (ref 5.0–8.0)

## 2023-12-11 LAB — CBC
HCT: 40.6 % (ref 36.0–46.0)
Hemoglobin: 12.9 g/dL (ref 12.0–15.0)
MCH: 24.7 pg — ABNORMAL LOW (ref 26.0–34.0)
MCHC: 31.8 g/dL (ref 30.0–36.0)
MCV: 77.8 fL — ABNORMAL LOW (ref 80.0–100.0)
Platelets: 283 10*3/uL (ref 150–400)
RBC: 5.22 MIL/uL — ABNORMAL HIGH (ref 3.87–5.11)
RDW: 13.2 % (ref 11.5–15.5)
WBC: 9 10*3/uL (ref 4.0–10.5)
nRBC: 0 % (ref 0.0–0.2)

## 2023-12-11 LAB — I-STAT VENOUS BLOOD GAS, ED
Acid-Base Excess: 1 mmol/L (ref 0.0–2.0)
Bicarbonate: 26.6 mmol/L (ref 20.0–28.0)
Calcium, Ion: 1.16 mmol/L (ref 1.15–1.40)
HCT: 42 % (ref 36.0–46.0)
Hemoglobin: 14.3 g/dL (ref 12.0–15.0)
O2 Saturation: 96 %
Potassium: 3.9 mmol/L (ref 3.5–5.1)
Sodium: 134 mmol/L — ABNORMAL LOW (ref 135–145)
TCO2: 28 mmol/L (ref 22–32)
pCO2, Ven: 43.9 mmHg — ABNORMAL LOW (ref 44–60)
pH, Ven: 7.391 (ref 7.25–7.43)
pO2, Ven: 86 mmHg — ABNORMAL HIGH (ref 32–45)

## 2023-12-11 LAB — RESP PANEL BY RT-PCR (RSV, FLU A&B, COVID)  RVPGX2
Influenza A by PCR: NEGATIVE
Influenza B by PCR: NEGATIVE
Resp Syncytial Virus by PCR: POSITIVE — AB
SARS Coronavirus 2 by RT PCR: NEGATIVE

## 2023-12-11 LAB — I-STAT CHEM 8, ED
BUN: 11 mg/dL (ref 6–20)
Calcium, Ion: 1.16 mmol/L (ref 1.15–1.40)
Chloride: 99 mmol/L (ref 98–111)
Creatinine, Ser: 0.4 mg/dL — ABNORMAL LOW (ref 0.44–1.00)
Glucose, Bld: 264 mg/dL — ABNORMAL HIGH (ref 70–99)
HCT: 42 % (ref 36.0–46.0)
Hemoglobin: 14.3 g/dL (ref 12.0–15.0)
Potassium: 3.9 mmol/L (ref 3.5–5.1)
Sodium: 133 mmol/L — ABNORMAL LOW (ref 135–145)
TCO2: 25 mmol/L (ref 22–32)

## 2023-12-11 LAB — BASIC METABOLIC PANEL
Anion gap: 11 (ref 5–15)
BUN: 10 mg/dL (ref 6–20)
CO2: 23 mmol/L (ref 22–32)
Calcium: 9.3 mg/dL (ref 8.9–10.3)
Chloride: 98 mmol/L (ref 98–111)
Creatinine, Ser: 0.56 mg/dL (ref 0.44–1.00)
GFR, Estimated: >60 mL/min (ref >60)
Glucose, Bld: 262 mg/dL — ABNORMAL HIGH (ref 70–99)
Potassium: 3.9 mmol/L (ref 3.5–5.1)
Sodium: 132 mmol/L — ABNORMAL LOW (ref 135–145)

## 2023-12-11 LAB — CBG MONITORING, ED: Glucose-Capillary: 260 mg/dL — ABNORMAL HIGH (ref 70–99)

## 2023-12-11 LAB — HCG, SERUM, QUALITATIVE: Preg, Serum: NEGATIVE

## 2023-12-11 MED ORDER — INSULIN ASPART 100 UNIT/ML IJ SOLN: 6.0000 [IU] | Freq: Once | INTRAMUSCULAR | Status: DC

## 2023-12-11 MED ORDER — INSULIN GLARGINE 100 UNIT/ML ~~LOC~~ SOLN
14.0000 [IU] | Freq: Every day | SUBCUTANEOUS | Status: DC
  Administered 2023-12-11: 14 [IU] via SUBCUTANEOUS
  Filled 2023-12-11: qty 0.14

## 2023-12-11 MED ORDER — SODIUM CHLORIDE 0.9 % IV BOLUS
1000.0000 mL | Freq: Once | INTRAVENOUS | Status: AC
  Administered 2023-12-11: 1000 mL via INTRAVENOUS

## 2023-12-11 NOTE — ED Triage Notes (Signed)
 Pt states that her CBG has been running high for the past few days, went to see PCP and they prescribed new meds but she was able to get it over the weekend due to insurance.

## 2023-12-11 NOTE — Discharge Instructions (Addendum)
 You are diagnosed with RSV which is a respiratory virus.  This typically runs its course in 4 to 5 days.  This may be making you feel bad with a cough, sore throat and fatigue.  Your blood sugar was high today at 262, but you did not have signs of diabetic ketoacidosis or serious complication. You were given 14 units of insulin long-acting, and 1 L of fluid.   Please pick up your insulin from the pharmacy tomorrow and begin taking it as prescribed.  You can follow-up with your doctor for any other concerns.

## 2023-12-11 NOTE — ED Provider Notes (Signed)
 [place] EMERGENCY DEPARTMENT AT [facility] Provider Note   CSN: [id number] Arrival date & time: 12/11/23  4034     History  Chief Complaint  Patient presents with   Hyperglycemia    Sherry Morris is a 43 y.o. female with history of obesity, type 2 diabetes, hidradenitis, presented to ED with complaint of high blood sugar.  Patient reports that she was told her A1c level was over 15 and her PCP had prescribed her insulin.  However she was not able to pick it up in the pharmacy this weekend due to our changes with the pharmacy.  She does have a prescription waiting for her tomorrow.  She is concerned because her blood sugar has been high as high as 400 at home.  She reports excessive thirst and urination.  Per my review of medical records the patient was admitted for DKA in March 2023.  She was discharged on insulin at that time Semglee 35 units and then metformin.  She reports she used this for several months but discontinued both of them because she did not want to be on these medications.  March 2025 - ha1c level of 15  HPI     Home Medications Prior to Admission medications   Medication Sig Start Date End Date Taking? Authorizing Provider  doxycycline (VIBRAMYCIN) 100 MG capsule Take 1 capsule (100 mg total) by mouth 2 (two) times daily. One po bid x 7 days 12/31/21   Palumbo, April, MD  ibuprofen (ADVIL) 200 MG tablet Take 200 mg by mouth every 6 (six) hours as needed for moderate pain.    [provider]  ibuprofen (ADVIL) 800 MG tablet Take 1 tablet (800 mg total) by mouth 3 (three) times daily. Patient not taking: Reported on 12/10/2021 01/08/20   Wieters, Hallie C, PA-C  Insulin Pen Needle 32G X 4 MM MISC Use to inject insulin up to 4 times daily as directed. 12/15/21   Atway, Derwood Kaplan, DO  levonorgestrel (MIRENA) 20 MCG/24HR IUD 1 each by Intrauterine route once. June 2018    [provider]  naproxen (NAPROSYN) 375 MG tablet Take 1 tablet (375  mg total) by mouth 2 (two) times daily with a meal. 12/31/21   Palumbo, April, MD      Allergies    Patient has no known allergies.    Review of Systems   Review of Systems  Physical Exam Updated Vital Signs BP 136/74 (BP Location: Left Arm)   Pulse 90   Temp 98.8 F (37.1 C) (Oral)   Resp 18   Ht 5\' 3"  (1.6 m)   Wt 131.5 kg   SpO2 99%   BMI 51.37 kg/m  Physical Exam Constitutional:      General: She is not in acute distress.    Appearance: She is obese.  HENT:     Head: Normocephalic and atraumatic.  Eyes:     Conjunctiva/sclera: Conjunctivae normal.     Pupils: Pupils are equal, round, and reactive to light.  Cardiovascular:     Rate and Rhythm: Regular rhythm. Tachycardia present.  Pulmonary:     Effort: Pulmonary effort is normal. No respiratory distress.  Abdominal:     General: There is no distension.     Tenderness: There is no abdominal tenderness.  Skin:    General: Skin is warm and dry.  Neurological:     General: No focal deficit present.     Mental Status: She is alert. Mental status is at  baseline.  Psychiatric:        Mood and Affect: Mood normal.        Behavior: Behavior normal.     ED Results / Procedures / Treatments   Labs (all labs ordered are listed, but only abnormal results are displayed) Labs Reviewed  RESP PANEL BY RT-PCR (RSV, FLU A&B, COVID)  RVPGX2 - Abnormal; Notable for the following components:      Result Value   Resp Syncytial Virus by PCR POSITIVE (*)    All other components within normal limits  URINALYSIS, ROUTINE W REFLEX MICROSCOPIC - Abnormal; Notable for the following components:   APPearance HAZY (*)    Glucose, UA 50 (*)    Hgb urine dipstick MODERATE (*)    All other components within normal limits  BASIC METABOLIC PANEL - Abnormal; Notable for the following components:   Sodium 132 (*)    Glucose, Bld 262 (*)    All other components within normal limits  CBC - Abnormal; Notable for the following components:    RBC 5.22 (*)    MCV 77.8 (*)    MCH 24.7 (*)    All other components within normal limits  CBG MONITORING, ED - Abnormal; Notable for the following components:   Glucose-Capillary 260 (*)    All other components within normal limits  I-STAT VENOUS BLOOD GAS, ED - Abnormal; Notable for the following components:   pCO2, Ven 43.9 (*)    pO2, Ven 86 (*)    Sodium 134 (*)    All other components within normal limits  I-STAT CHEM 8, ED - Abnormal; Notable for the following components:   Sodium 133 (*)    Creatinine, Ser 0.40 (*)    Glucose, Bld 264 (*)    All other components within normal limits  HCG, SERUM, QUALITATIVE    EKG None  Radiology No results found.  Procedures Procedures    Medications Ordered in ED Medications  insulin glargine (LANTUS) injection 14 Units (14 Units Subcutaneous Given 12/11/23 0921)  sodium chloride 0.9 % bolus 1,000 mL (0 mLs Intravenous Stopped 12/11/23 0908)    ED Course/ Medical Decision Making/ A&P Clinical Course as of 12/11/23 0951  Sun Dec 11, 2023  0815 Respiratory Syncytial Virus by PCR(!): POSITIVE [MT]  0815 No ketones in urine [MT]    Clinical Course User Index [MT] Terald Sleeper, MD                                 Medical Decision Making Amount and/or Complexity of Data Reviewed Labs: ordered. Decision-making details documented in ED Course.  Risk Prescription drug management.   This patient presents to the ED with concern for high blood sugar, thirst. This involves an extensive number of treatment options, and is a complaint that carries with it a high risk of complications and morbidity.  The differential diagnosis includes DKA versus symptomatic hyperglycemia versus viral illness versus other  Co-morbidities that complicate the patient evaluation: History of diabetes at high risk of metabolic derangement  External records from outside source obtained and reviewed including outpatient PCP office records from earlier  this month, patient is prescribed Lantus beginning of 14 units daily titrated up.  A1c level elevated.  Most recent hospital discharge summary from 2023  I ordered and personally interpreted labs.  The pertinent results include: VBG with no acidosis.  BMP with glucose 262, no anion gap.  UA  without ketones.  RSV test is positive on viral panel, likely attributing to the patient's current symptoms of congestion and cough  The patient was maintained on a cardiac monitor.  I personally viewed and interpreted the cardiac monitored which showed an underlying rhythm of: Sinus tachycardia  I ordered medication including IV fluid bolus and long-acting insulin 14 units for hyperglycemia  I have reviewed the patients home medicines and have made adjustments as needed  Test Considered: No indication for x-ray imaging of the chest or CT imaging of the chest abdomen pelvis.  Low suspicion for pneumonia.  After the interventions noted above, I reevaluated the patient and found that they have: improved   Dispostion:  After consideration of the diagnostic results and the patients response to treatment, I feel that the patent would benefit from outpatient follow-up, encouraged to fill her insulin prescription begin taking it as directed at home tomorrow.  A work note was provided.  Patient verbalized understanding         Final Clinical Impression(s) / ED Diagnoses Final diagnoses:  Hyperglycemia  RSV (acute bronchiolitis due to respiratory syncytial virus)    Rx / DC Orders ED Discharge Orders     None         Doloris Servantes, Kermit Balo, MD 12/11/23 [id number]

## 2024-03-28 ENCOUNTER — Ambulatory Visit: Payer: Self-pay | Admitting: Surgery

## 2024-03-28 NOTE — H&P (Signed)
 Sherry Morris [phone]   Referring Provider:  Jolee Madelin Patch, MD   Subjective   Chief Complaint: New Consultation (hidradenitis suppurativa, left axilla and central back)     History of Present Illness:    43 year old woman with history of diabetes, obesity, and hidradenitis suppurativa who presents for consultation regarding the latter.  She has struggled with this for at least 4 or 5 years.  She denies any previous surgical procedures for this but has seen a dermatologist and has had local steroid injections.  Reports that dairy consumption does exacerbate her symptoms.  Reports recurrent boils in bilateral axilla and on the mid back.  Several family members with hidradenitis and various procedures they have been through.  Currently most painful is the lesion in the right axilla.  Review of Systems: A complete review of systems was obtained from the patient.  I have reviewed this information and discussed as appropriate with the patient.  See HPI as well for other ROS.   Medical History: Past Medical History:  Diagnosis Date   Diabetes mellitus without complication (CMS/HHS-HCC)    Hyperlipidemia    Thyroid disease     There is no problem list on file for this patient.   Past Surgical History:  Procedure Laterality Date   CESAREAN SECTION     Thyroid Surgery       No Known Allergies  Current Outpatient Medications on File Prior to Visit  Medication Sig Dispense Refill   blood-glucose sensor (DEXCOM G7 SENSOR) Devi 1 each by Other route     insulin  GLARGINE (LANTUS  SOLOSTAR) pen injector (concentration 100 units/mL) Inject 20 Units subcutaneously     levocetirizine (XYZAL) 5 MG tablet Take 5 mg by mouth at bedtime     levonorgestreL (MIRENA 52 MG) IUD Insert 1 each into the uterus     OZEMPIC 2 mg/dose (8 mg/3 mL) pen injector ADMINISTER 2 MG UNDER THE SKIN EVERY 7 DAYS     rosuvastatin (CRESTOR) 5 MG tablet Take 5 mg by mouth once daily     No current  facility-administered medications on file prior to visit.    Family History  Problem Relation Age of Onset   High blood pressure (Hypertension) Mother    Hyperlipidemia (Elevated cholesterol) Mother    Hyperlipidemia (Elevated cholesterol) Father    Deep vein thrombosis (DVT or abnormal blood clot formation) Father      Social History   Tobacco Use  Smoking Status Former   Types: Cigarettes  Smokeless Tobacco Never     Social History   Socioeconomic History   Marital status: Single  Tobacco Use   Smoking status: Former    Types: Cigarettes   Smokeless tobacco: Never  Vaping Use   Vaping status: Never Used  Substance and Sexual Activity   Alcohol use: Yes    Alcohol/week: 2.0 - 6.0 standard drinks of alcohol    Types: 2 - 6 Standard drinks or equivalent per week   Drug use: Never   Social Drivers of Health   Financial Resource Strain: Medium Risk (04/01/2022)   [facility] prior to 11/27/2022.   Overall Financial Resource Strain (CARDIA)    Difficulty of Paying Living Expenses: Somewhat hard  Food Insecurity: Low Risk  (12/07/2023)   Received from [facility]   Hunger Vital Sign    Within the past 12 months, you worried that your food would run out before you got money to buy more: Never  true    Within the past 12 months, the food you bought just didn't last and you didn't have money to get more. : Never true  Transportation Needs: No Transportation Needs (12/07/2023)   Received from [facility]   Transportation    In the past 12 months, has lack of reliable transportation kept you from medical appointments, meetings, work or from getting things needed for daily living? : No  Physical Activity: Inactive (04/01/2022)   Received from [facility], [facility] visits prior to 11/27/2022.   Exercise Vital Sign    On average, how many days per week do you engage in moderate to strenuous exercise  (like a brisk walk)?: 0 days    On average, how many minutes do you engage in exercise at this level?: 0 min  Stress: Stress Concern Present (04/01/2022)   Received from [facility], [facility] visits prior to 11/27/2022.   [employer] of Occupational Health - Occupational Stress Questionnaire    Feeling of Stress : To some extent  Social Connections: Socially Isolated (04/01/2022)   Received from [facility], [facility] visits prior to 11/27/2022.   Social Connection and Isolation Panel    In a typical week, how many times do you talk on the phone with family, friends, or neighbors?: Twice a week    How often do you get together with friends or relatives?: Never    How often do you attend church or religious services?: Never    Do you belong to any clubs or organizations such as church groups, unions, fraternal or athletic groups, or school groups?: No    How often do you attend meetings of the clubs or organizations you belong to?: Never    Are you married, widowed, divorced, separated, never married, or living with a partner?: Never married  Housing Stability: Low Risk  (12/07/2023)   Received from [employer] Stability Vital Sign    What is your living situation today?: I have a steady place to live    Think about the place you live. Do you have problems with any of the following? Choose all that apply:: None/None on this list    Objective:    Vitals:   03/28/24 1508 03/28/24 1509  BP: 106/74   Pulse: 104   Temp: 36.7 C (98.1 F)   SpO2: 95%   Weight: (!) 131.5 kg (289 lb 12.8 oz)   Height: 162.6 cm (5' 4)   PainSc:  0-No pain    Body mass index is 49.74 kg/m.  Gen: A&Ox3, no distress  Chest: respiratory effort is normal. Neuro: no gross deficit Psych: appropriate mood and affect, normal insight/judgment intact  Skin: warm and dry.  In the right axilla is an area of chronic scar and drainage measuring  approximately 7 x 5 cm which is mildly tender with active purulent drainage.  In the left axilla, there is area of scar with several surrounding scars but no active sinus tract or drainage.  On the mid back where there is a crease there is an approximately 6 cm vertical area of induration and chronic scarring but no active abscess.    Assessment and Plan:  Diagnoses and all orders for this visit:  Hidradenitis suppurativa   Discussed the natural history/ pathophysiology of this disease. Discussed the gamut of management options including: -Smoking cessation -No shave, no chemical or perfumed soaps or deodorants -Keep area clean and  dry as possible, wear breathable clothing -Brewer's yeast free diet -Topical treatments including antibiotic ointment (clindamycin) with or without retinoin -Suppressive oral antibiotics -Dermatology referral to discuss immunomodulation therapy -and finally surgery which ranges from local unroofing to wide excision with rotational flap coverage would be the only way to ensure no further flares, we discussed that this is a large surgery requiring several weeks of wound healing, with risk of bleeding, infection, pain, scarring and nearly 100% issue with wound healing. Discussed that would only do one side at a time.  Patient wishes to pursue excision.  We will go ahead with local excision of the more painful lesion on the right axilla.  Questions welcomed and answered to her satisfaction.    Mitzie Freund MD FACS

## 2024-04-30 NOTE — Patient Instructions (Signed)
 SURGICAL WAITING ROOM VISITATION  Patients having surgery or a procedure may have no more than 2 support people in the waiting area - these visitors may rotate.    Children under the age of 20 must have an adult with them who is not the patient.  Visitors with respiratory illnesses are discouraged from visiting and should remain at home.  If the patient needs to stay at the hospital during part of their recovery, the visitor guidelines for inpatient rooms apply. Pre-op nurse will coordinate an appropriate time for 1 support person to accompany patient in pre-op.  This support person may not rotate.    Please refer to the [facility] website for the visitor guidelines for Inpatients (after your surgery is over and you are in a regular room).       Your procedure is scheduled on:  05/11/2024    Report to [facility] Main Entrance    Report to admitting at  1230 pm     Call this number if you have problems the morning of surgery [phone]   Do not eat food :After Midnight.   After Midnight you may have the following liquids until __ 1130____ AM  DAY OF SURGERY  Water Non-Citrus Juices (without pulp, NO RED-Apple, White grape, White cranberry) Black Coffee (NO MILK/CREAM OR CREAMERS, sugar ok)  Clear Tea (NO MILK/CREAM OR CREAMERS, sugar ok) regular and decaf                             Plain Jell-O (NO RED)                                           Fruit ices (not with fruit pulp, NO RED)                                     Popsicles (NO RED)                                                               Sports drinks like Gatorade (NO RED)                            If you have questions, please contact your surgeon's office.     Oral Hygiene is also important to reduce your risk of infection.                                    Remember - BRUSH YOUR TEETH THE MORNING OF SURGERY WITH YOUR REGULAR TOOTHPASTE  DENTURES WILL BE REMOVED PRIOR TO SURGERY PLEASE DO NOT  APPLY Poly grip OR ADHESIVES!!!   Do NOT smoke after Midnight   Stop all vitamins and herbal supplements 7 days before surgery.   Take these medicines the morning of surgery with A SIP OF WATER:  none              Ozempic- last dose on  Lantus -   DO NOT TAKE ANY ORAL DIABETIC MEDICATIONS DAY OF YOUR SURGERY  Bring CPAP mask and tubing day of surgery.                              You may not have any metal on your body including hair pins, jewelry, and body piercing             Do not wear make-up, lotions, powders, perfumes/cologne, or deodorant  Do not wear nail polish including gel and S&S, artificial/acrylic nails, or any other type of covering on natural nails including finger and toenails. If you have artificial nails, gel coating, etc. that needs to be removed by a nail salon please have this removed prior to surgery or surgery may need to be canceled/ delayed if the surgeon/ anesthesia feels like they are unable to be safely monitored.   Do not shave  48 hours prior to surgery.               Men may shave face and neck.   Do not bring valuables to the hospital. [facility] IS NOT             RESPONSIBLE   FOR VALUABLES.   Contacts, glasses, dentures or bridgework may not be worn into surgery.   Bring small overnight bag day of surgery.   DO NOT BRING YOUR HOME MEDICATIONS TO THE HOSPITAL. PHARMACY WILL DISPENSE MEDICATIONS LISTED ON YOUR MEDICATION LIST TO YOU DURING YOUR ADMISSION IN THE HOSPITAL!    Patients discharged on the day of surgery will not be allowed to drive home.  Someone NEEDS to stay with you for the first 24 hours after anesthesia.   Special Instructions: Bring a copy of your healthcare power of attorney and living will documents the day of surgery if you haven't scanned them before.              Please read over the following fact sheets you were given: IF YOU HAVE QUESTIONS ABOUT YOUR PRE-OP INSTRUCTIONS PLEASE CALL [phone].   If you  received a COVID test during your pre-op visit  it is requested that you wear a mask when out in public, stay away from anyone that may not be feeling well and notify your surgeon if you develop symptoms. If you test positive for Covid or have been in contact with anyone that has tested positive in the last 10 days please notify you surgeon.    [facility] - Preparing for Surgery Before surgery, you can play an important role.  Because skin is not sterile, your skin needs to be as free of germs as possible.  You can reduce the number of germs on your skin by washing with CHG (chlorahexidine gluconate) soap before surgery.  CHG is an antiseptic cleaner which kills germs and bonds with the skin to continue killing germs even after washing. Please DO NOT use if you have an allergy to CHG or antibacterial soaps.  If your skin becomes reddened/irritated stop using the CHG and inform your nurse when you arrive at Short Stay. Do not shave (including legs and underarms) for at least 48 hours prior to the first CHG shower.  You may shave your face/neck. Please follow these instructions carefully:  1.  Shower with CHG Soap the night before surgery and the  morning of Surgery.  2.  If you choose to wash your hair, wash your hair  first as usual with your  normal  shampoo.  3.  After you shampoo, rinse your hair and body thoroughly to remove the  shampoo.                           4.  Use CHG as you would any other liquid soap.  You can apply chg directly  to the skin and wash                       Gently with a scrungie or clean washcloth.  5.  Apply the CHG Soap to your body ONLY FROM THE NECK DOWN.   Do not use on face/ open                           Wound or open sores. Avoid contact with eyes, ears mouth and genitals (private parts).                       Wash face,  Genitals (private parts) with your normal soap.             6.  Wash thoroughly, paying special attention to the area where your surgery  will be  performed.  7.  Thoroughly rinse your body with warm water from the neck down.  8.  DO NOT shower/wash with your normal soap after using and rinsing off  the CHG Soap.                9.  Pat yourself dry with a clean towel.            10.  Wear clean pajamas.            11.  Place clean sheets on your bed the night of your first shower and do not  sleep with pets. Day of Surgery : Do not apply any lotions/deodorants the morning of surgery.  Please wear clean clothes to the hospital/surgery center.  FAILURE TO FOLLOW THESE INSTRUCTIONS MAY RESULT IN THE CANCELLATION OF YOUR SURGERY PATIENT SIGNATURE_________________________________  NURSE SIGNATURE__________________________________  ________________________________________________________________________

## 2024-04-30 NOTE — Progress Notes (Signed)
 Anesthesia Review:  PCP: Cardiologist :  PPM/ ICD: Device Orders: Rep Notified:  Chest x-ray : EKG : Echo : Stress test: Cardiac Cath :   Activity level:  Sleep Study/ CPAP : Fasting Blood Sugar :      / Checks Blood Sugar -- times a day:   DM- type  Hgba1c-03/05/24-7.1  Ozempicv - last dose on  Lantus -    Blood Thinner/ Instructions /Last Dose: ASA / Instructions/ Last Dose :    03/05/24-hgba1c-7.1

## 2024-05-02 ENCOUNTER — Other Ambulatory Visit: Payer: Self-pay

## 2024-05-02 ENCOUNTER — Encounter (HOSPITAL_COMMUNITY): Payer: Self-pay

## 2024-05-02 ENCOUNTER — Encounter (HOSPITAL_COMMUNITY)
Admission: RE | Admit: 2024-05-02 | Discharge: 2024-05-02 | Disposition: A | Discharge status: Home / Self Care | Source: Ambulatory Visit | Attending: Surgery | Admitting: Surgery

## 2024-05-02 VITALS — BP 142/85 | HR 89 | Temp 98.4°F | Resp 16 | Ht 64.0 in | Wt 288.8 lb

## 2024-05-02 DIAGNOSIS — Z01818 Encounter for other preprocedural examination: Secondary | ICD-10-CM | POA: Diagnosis present

## 2024-05-02 DIAGNOSIS — E119 Type 2 diabetes mellitus without complications: Secondary | ICD-10-CM | POA: Insufficient documentation

## 2024-05-02 HISTORY — DX: Depression, unspecified: F32.A

## 2024-05-02 LAB — CBC
HCT: 36.9 % (ref 36.0–46.0)
Hemoglobin: 11.5 g/dL — ABNORMAL LOW (ref 12.0–15.0)
MCH: 24.6 pg — ABNORMAL LOW (ref 26.0–34.0)
MCHC: 31.2 g/dL (ref 30.0–36.0)
MCV: 78.8 fL — ABNORMAL LOW (ref 80.0–100.0)
Platelets: 307 K/uL (ref 150–400)
RBC: 4.68 MIL/uL (ref 3.87–5.11)
RDW: 14.8 % (ref 11.5–15.5)
WBC: 10.7 K/uL — ABNORMAL HIGH (ref 4.0–10.5)
nRBC: 0 % (ref 0.0–0.2)

## 2024-05-02 LAB — BASIC METABOLIC PANEL WITH GFR
Anion gap: 9 (ref 5–15)
BUN: 11 mg/dL (ref 6–20)
CO2: 24 mmol/L (ref 22–32)
Calcium: 8.8 mg/dL — ABNORMAL LOW (ref 8.9–10.3)
Chloride: 103 mmol/L (ref 98–111)
Creatinine, Ser: 0.51 mg/dL (ref 0.44–1.00)
GFR, Estimated: >60 mL/min (ref >60)
Glucose, Bld: 114 mg/dL — ABNORMAL HIGH (ref 70–99)
Potassium: 3.4 mmol/L — ABNORMAL LOW (ref 3.5–5.1)
Sodium: 136 mmol/L (ref 135–145)

## 2024-05-02 LAB — GLUCOSE, CAPILLARY: Glucose-Capillary: 110 mg/dL — ABNORMAL HIGH (ref 70–99)

## 2024-05-03 LAB — HEMOGLOBIN A1C
Hgb A1c MFr Bld: 6.2 % — ABNORMAL HIGH (ref 4.8–5.6)
Mean Plasma Glucose: 131 mg/dL

## 2024-05-11 ENCOUNTER — Other Ambulatory Visit: Payer: Self-pay

## 2024-05-11 ENCOUNTER — Encounter (HOSPITAL_COMMUNITY): Payer: Self-pay | Admitting: Surgery

## 2024-05-11 ENCOUNTER — Ambulatory Visit (HOSPITAL_BASED_OUTPATIENT_CLINIC_OR_DEPARTMENT_OTHER): Admitting: Anesthesiology

## 2024-05-11 ENCOUNTER — Encounter (HOSPITAL_COMMUNITY)
Admission: RE | Disposition: A | Discharge status: Home / Self Care | Payer: Self-pay | Source: Home / Self Care | Attending: Surgery

## 2024-05-11 ENCOUNTER — Ambulatory Visit (HOSPITAL_COMMUNITY)
Admission: RE | Admit: 2024-05-11 | Discharge: 2024-05-11 | Disposition: A | Discharge status: Home / Self Care | Attending: Surgery | Admitting: Surgery

## 2024-05-11 ENCOUNTER — Ambulatory Visit (HOSPITAL_COMMUNITY): Payer: Self-pay | Admitting: Physician Assistant

## 2024-05-11 DIAGNOSIS — L732 Hidradenitis suppurativa: Secondary | ICD-10-CM | POA: Insufficient documentation

## 2024-05-11 DIAGNOSIS — Z6841 Body Mass Index (BMI) 40.0 and over, adult: Secondary | ICD-10-CM | POA: Insufficient documentation

## 2024-05-11 DIAGNOSIS — Z01818 Encounter for other preprocedural examination: Secondary | ICD-10-CM

## 2024-05-11 DIAGNOSIS — Z87891 Personal history of nicotine dependence: Secondary | ICD-10-CM | POA: Diagnosis not present

## 2024-05-11 DIAGNOSIS — E66813 Obesity, class 3: Secondary | ICD-10-CM

## 2024-05-11 DIAGNOSIS — E119 Type 2 diabetes mellitus without complications: Secondary | ICD-10-CM | POA: Insufficient documentation

## 2024-05-11 DIAGNOSIS — Z794 Long term (current) use of insulin: Secondary | ICD-10-CM | POA: Insufficient documentation

## 2024-05-11 DIAGNOSIS — Z7985 Long-term (current) use of injectable non-insulin antidiabetic drugs: Secondary | ICD-10-CM | POA: Diagnosis not present

## 2024-05-11 HISTORY — PX: HYDRADENITIS EXCISION: SHX5243

## 2024-05-11 LAB — GLUCOSE, CAPILLARY
Glucose-Capillary: 93 mg/dL (ref 70–99)
Glucose-Capillary: 97 mg/dL (ref 70–99)

## 2024-05-11 LAB — POCT PREGNANCY, URINE: Preg Test, Ur: NEGATIVE

## 2024-05-11 SURGERY — EXCISION, HIDRADENITIS, AXILLA
Anesthesia: Monitor Anesthesia Care | Laterality: Right

## 2024-05-11 MED ORDER — LACTATED RINGERS IV SOLN
INTRAVENOUS | Status: DC | PRN
Start: 2024-05-11 — End: 2024-05-11

## 2024-05-11 MED ORDER — CHLORHEXIDINE GLUCONATE 4 % EX SOLN: 60.0000 mL | Freq: Once | CUTANEOUS | Status: DC

## 2024-05-11 MED ORDER — FENTANYL CITRATE (PF) 100 MCG/2ML IJ SOLN
INTRAMUSCULAR | Status: AC
  Filled 2024-05-11: qty 2

## 2024-05-11 MED ORDER — PROPOFOL 10 MG/ML IV BOLUS
INTRAVENOUS | Status: AC
  Filled 2024-05-11: qty 20

## 2024-05-11 MED ORDER — MIDAZOLAM HCL 2 MG/2ML IJ SOLN
INTRAMUSCULAR | Status: AC
  Filled 2024-05-11: qty 2

## 2024-05-11 MED ORDER — GABAPENTIN 300 MG PO CAPS
300.0000 mg | ORAL_CAPSULE | ORAL | Status: AC
  Administered 2024-05-11: 300 mg via ORAL
  Filled 2024-05-11: qty 1

## 2024-05-11 MED ORDER — 0.9 % SODIUM CHLORIDE (POUR BTL) OPTIME
TOPICAL | Status: DC | PRN
  Administered 2024-05-11: 1000 mL

## 2024-05-11 MED ORDER — CEFAZOLIN SODIUM-DEXTROSE 3-4 GM/150ML-% IV SOLN
3.0000 g | INTRAVENOUS | Status: AC
  Administered 2024-05-11: 3 g via INTRAVENOUS
  Filled 2024-05-11: qty 150

## 2024-05-11 MED ORDER — OXYCODONE HCL 5 MG PO TABS
5.0000 mg | ORAL_TABLET | Freq: Once | ORAL | Status: AC | PRN
  Administered 2024-05-11: 5 mg via ORAL

## 2024-05-11 MED ORDER — CHLORHEXIDINE GLUCONATE 0.12 % MT SOLN
15.0000 mL | Freq: Once | OROMUCOSAL | Status: AC
  Administered 2024-05-11: 15 mL via OROMUCOSAL

## 2024-05-11 MED ORDER — FENTANYL CITRATE PF 50 MCG/ML IJ SOSY
25.0000 ug | PREFILLED_SYRINGE | INTRAMUSCULAR | Status: DC | PRN
  Administered 2024-05-11 (×2): 50 ug via INTRAVENOUS

## 2024-05-11 MED ORDER — OXYCODONE HCL 5 MG/5ML PO SOLN: 5.0000 mg | Freq: Once | ORAL | Status: AC | PRN

## 2024-05-11 MED ORDER — ORAL CARE MOUTH RINSE: 15.0000 mL | Freq: Once | OROMUCOSAL | Status: AC

## 2024-05-11 MED ORDER — FENTANYL CITRATE (PF) 100 MCG/2ML IJ SOLN
INTRAMUSCULAR | Status: DC | PRN
  Administered 2024-05-11: 50 ug via INTRAVENOUS
  Administered 2024-05-11: 100 ug via INTRAVENOUS
  Administered 2024-05-11: 50 ug via INTRAVENOUS

## 2024-05-11 MED ORDER — PROPOFOL 10 MG/ML IV BOLUS
INTRAVENOUS | Status: DC | PRN
  Administered 2024-05-11: 200 mg via INTRAVENOUS

## 2024-05-11 MED ORDER — OXYCODONE HCL 5 MG PO TABS
ORAL_TABLET | ORAL | Status: AC
  Filled 2024-05-11: qty 1

## 2024-05-11 MED ORDER — ACETAMINOPHEN 500 MG PO TABS
1000.0000 mg | ORAL_TABLET | ORAL | Status: AC
  Administered 2024-05-11: 1000 mg via ORAL
  Filled 2024-05-11: qty 2

## 2024-05-11 MED ORDER — LIDOCAINE HCL (CARDIAC) PF 100 MG/5ML IV SOSY
PREFILLED_SYRINGE | INTRAVENOUS | Status: DC | PRN
  Administered 2024-05-11: 100 mg via INTRATRACHEAL

## 2024-05-11 MED ORDER — DEXAMETHASONE SODIUM PHOSPHATE 4 MG/ML IJ SOLN
INTRAMUSCULAR | Status: DC | PRN
  Administered 2024-05-11: 5 mg via INTRAVENOUS

## 2024-05-11 MED ORDER — DOCUSATE SODIUM 100 MG PO CAPS: 100.0000 mg | ORAL_CAPSULE | Freq: Two times a day (BID) | ORAL | 0 refills | Status: AC

## 2024-05-11 MED ORDER — DEXMEDETOMIDINE HCL IN NACL 200 MCG/50ML IV SOLN
INTRAVENOUS | Status: DC | PRN
Start: 2024-05-11 — End: 2024-05-11
  Administered 2024-05-11 (×2): 8 ug via INTRAVENOUS

## 2024-05-11 MED ORDER — DEXAMETHASONE SODIUM PHOSPHATE 10 MG/ML IJ SOLN
INTRAMUSCULAR | Status: AC
  Filled 2024-05-11: qty 1

## 2024-05-11 MED ORDER — OXYCODONE HCL 5 MG PO TABS: 5.0000 mg | ORAL_TABLET | Freq: Three times a day (TID) | ORAL | 0 refills | Status: AC | PRN

## 2024-05-11 MED ORDER — FENTANYL CITRATE PF 50 MCG/ML IJ SOSY
PREFILLED_SYRINGE | INTRAMUSCULAR | Status: AC
Start: 2024-05-11 — End: 2024-05-11
  Filled 2024-05-11: qty 1

## 2024-05-11 MED ORDER — BUPIVACAINE-EPINEPHRINE (PF) 0.25% -1:200000 IJ SOLN
INTRAMUSCULAR | Status: AC
  Filled 2024-05-11: qty 30

## 2024-05-11 MED ORDER — FENTANYL CITRATE PF 50 MCG/ML IJ SOSY
PREFILLED_SYRINGE | INTRAMUSCULAR | Status: AC
  Filled 2024-05-11: qty 1

## 2024-05-11 MED ORDER — BUPIVACAINE-EPINEPHRINE (PF) 0.25% -1:200000 IJ SOLN
INTRAMUSCULAR | Status: DC | PRN
  Administered 2024-05-11: 20 mL

## 2024-05-11 MED ORDER — LACTATED RINGERS IV SOLN: INTRAVENOUS | Status: DC

## 2024-05-11 MED ORDER — DROPERIDOL 2.5 MG/ML IJ SOLN: 0.6250 mg | Freq: Once | INTRAMUSCULAR | Status: DC | PRN

## 2024-05-11 MED ORDER — ONDANSETRON HCL 4 MG/2ML IJ SOLN
INTRAMUSCULAR | Status: DC | PRN
  Administered 2024-05-11: 4 mg via INTRAVENOUS

## 2024-05-11 MED ORDER — ONDANSETRON HCL 4 MG/2ML IJ SOLN
INTRAMUSCULAR | Status: AC
  Filled 2024-05-11: qty 2

## 2024-05-11 SURGICAL SUPPLY — 31 items
BAG COUNTER SPONGE SURGICOUNT (BAG) IMPLANT
BINDER BREAST LRG (GAUZE/BANDAGES/DRESSINGS) IMPLANT
BLADE SURG SZ10 CARB STEEL (BLADE) ×2 IMPLANT
BNDG GAUZE DERMACEA FLUFF 4 (GAUZE/BANDAGES/DRESSINGS) IMPLANT
CHLORAPREP W/TINT 26 (MISCELLANEOUS) ×2 IMPLANT
COVER SURGICAL LIGHT HANDLE (MISCELLANEOUS) ×2 IMPLANT
DRAIN PENROSE 0.25X18 (DRAIN) IMPLANT
DRAPE LAPAROTOMY TRNSV 102X78 (DRAPES) ×2 IMPLANT
ELECT REM PT RETURN 15FT ADLT (MISCELLANEOUS) ×2 IMPLANT
GAUZE 4X4 16PLY ~~LOC~~+RFID DBL (SPONGE) ×2 IMPLANT
GAUZE PAD ABD 8X10 STRL (GAUZE/BANDAGES/DRESSINGS) IMPLANT
GAUZE SPONGE 4X4 12PLY STRL (GAUZE/BANDAGES/DRESSINGS) IMPLANT
GLOVE BIO SURGEON STRL SZ 6 (GLOVE) ×2 IMPLANT
GLOVE INDICATOR 6.5 STRL GRN (GLOVE) ×2 IMPLANT
GLOVE SS BIOGEL STRL SZ 6 (GLOVE) ×2 IMPLANT
GOWN STRL REUS W/ TWL LRG LVL3 (GOWN DISPOSABLE) ×2 IMPLANT
GOWN STRL REUS W/ TWL XL LVL3 (GOWN DISPOSABLE) IMPLANT
KIT BASIN OR (CUSTOM PROCEDURE TRAY) ×2 IMPLANT
KIT TURNOVER KIT A (KITS) ×2 IMPLANT
MARKER SKIN DUAL TIP RULER LAB (MISCELLANEOUS) ×2 IMPLANT
MAT PREVALON FULL STRYKER (MISCELLANEOUS) IMPLANT
NDL HYPO 22X1.5 SAFETY MO (MISCELLANEOUS) IMPLANT
NEEDLE HYPO 22X1.5 SAFETY MO (MISCELLANEOUS) IMPLANT
PACK GENERAL/GYN (CUSTOM PROCEDURE TRAY) ×2 IMPLANT
SPIKE FLUID TRANSFER (MISCELLANEOUS) ×2 IMPLANT
SUT MNCRL AB 4-0 PS2 18 (SUTURE) ×2 IMPLANT
SUT PROLENE 3 0 PS 1 (SUTURE) IMPLANT
SUT VIC AB 3-0 SH 27XBRD (SUTURE) IMPLANT
SYR CONTROL 10ML LL (SYRINGE) IMPLANT
TAPE CLOTH SURG 4X10 WHT LF (GAUZE/BANDAGES/DRESSINGS) IMPLANT
TOWEL OR 17X26 10 PK STRL BLUE (TOWEL DISPOSABLE) ×2 IMPLANT

## 2024-05-11 NOTE — Op Note (Signed)
 Operative Note  OLITA TAKESHITA  [id number]  [id number]  05/11/2024   Surgeon: Mitzie Freund MD FACS  Procedure performed: Excision of right axillary skin and subcutaneous tissue for hidradenitis, 12cm x 8cm x 7cm   Preop diagnosis: Hidradenitis Post-op diagnosis/intraop findings: Same   Specimens: Right axillary hidradenitis Retained items: no  EBL: 10cc Complications: none   Description of procedure: After confirming informed consent the patient was taken to the operating room and placed supine on the operating room table where general LMA anesthesia was initiated, preoperative antibiotics were administered, SCDs applied, and a formal timeout was performed.  The right axilla and surrounding areas were prepped and draped in usual sterile fashion.  The area of chronic drainage and scarring was outlined and after infiltration with local, an elliptical incision was made to encompass the entirety of the abnormal tissue.  Cautery was then used to divide the dermis and soft tissue, dissecting all chronically inflamed tissue free of surrounding healthy tissue.  In the central portion of the axilla this did extend about 7 cm deep.  Once excised, the specimen was handed off for pathology.  Hemostasis was ensured with cautery.  At the anterior superior aspect of the wound and a deep area there was some ongoing oozing which was addressed with 3-0 Vicryl suture ligation.  Hemostasis was confirmed.  The wound was then closed with interrupted 3-0 Vicryl's in the deep dermis followed by a running 3-0 Prolene.  A light pressure dressing of gauze and tape was then applied.  The patient was then awakened, extubated and taken to PACU in stable condition.    All counts were correct at the completion of the case.

## 2024-05-11 NOTE — Discharge Instructions (Signed)
 GENERAL SURGERY: POST OP INSTRUCTIONS  EAT Gradually transition to a high fiber diet with a fiber supplement over the next few weeks after discharge.  WALK Walk an hour a day (cumulative, not all at once).  Control your pain to do that.    CONTROL PAIN Control pain so that you can walk, sleep, tolerate sneezing/coughing, go up/down stairs.  HAVE A BOWEL MOVEMENT DAILY Keep your bowels regular to avoid problems.  OK to try a laxative to override constipation.  OK to use an antidairrheal to slow down diarrhea.  Call if not better after 2 tries  CALL IF YOU HAVE PROBLEMS/CONCERNS Call if you are still struggling despite following these instructions. Call if you have concerns not answered by these instructions    DIET: Follow a light bland diet & liquids the first 24 hours after arrival home, such as soup, liquids, starches, etc.  Be sure to drink plenty of fluids.  Quickly advance to a usual solid diet within a few days.  Avoid fast food or heavy meals as your are more likely to get nauseated or have irregular bowels.  A low-sugar, high-fiber diet for the rest of your life is ideal.   Take your usually prescribed home medications unless otherwise directed. PAIN CONTROL: Pain is best controlled by a usual combination of three different methods TOGETHER: Ice/Heat Over the counter pain medication Prescription pain medication Most patients will experience some swelling and bruising around the incisions.  Ice packs or heating pads (30-60 minutes up to 6 times a day) will help. Use ice for the first few days to help decrease swelling and bruising, then switch to heat to help relax tight/sore spots and speed recovery.  Some people prefer to use ice alone, heat alone, alternating between ice & heat.  Experiment to what works for you.  Swelling and bruising can take several weeks to resolve.   It is helpful to take an over-the-counter pain medication regularly for the first few weeks.  Choose one of  the following that works best for you: Naproxen  (Aleve , etc)  Two 220mg  tabs twice a day OR Ibuprofen  (Advil , etc) Three 200mg  tabs four times a day (every meal & bedtime) AND Acetaminophen  (Tylenol , etc) 500-650mg  four times a day (every meal & bedtime) A  prescription for pain medication (such as oxycodone , hydrocodone , etc) should be given to you upon discharge.  Take your pain medication as prescribed.  If you are having problems/concerns with the prescription medicine (does not control pain, nausea, vomiting, rash, itching, etc), please call us  [phone] to see if we need to switch you to a different pain medicine that will work better for you and/or control your side effect better. If you need a refill on your pain medication, please contact your pharmacy.  They will contact our office to request authorization. Prescriptions will not be filled after 5 pm or on week-ends. Avoid getting constipated.  Between the surgery and the pain medications, it is common to experience some constipation.  Increasing fluid intake and taking a fiber supplement (such as Metamucil, Citrucel, FiberCon, MiraLax, etc) 1-2 times a day regularly will usually help prevent this problem from occurring.  A mild laxative (prune juice, Milk of Magnesia, MiraLax, etc) should be taken according to package directions if there are no bowel movements after 48 hours.   Wash / shower every day, starting 2 days after surgery.  Continue to shower over incision(s) after the dressing is off. Let the soap and water run  over the incision and pat dry. No rubbing, scrubbing, lotions or ointments to incision. Do not soak or submerge incision.  Remove your outer bandage 2 days after surgery.  You may leave the incision open to air.  You may replace a dressing/Band-Aid to cover the incision for comfort if you wish.    ACTIVITIES as tolerated:   You may resume regular (light) daily activities beginning the next day--such as daily self-care,  walking, climbing stairs--gradually increasing activities as tolerated.  If you can walk 30 minutes without difficulty, it is safe to try more intense activity such as jogging, treadmill, bicycling, low-impact aerobics, swimming, etc. Save the most intensive and strenuous activity for last such as sit-ups, heavy lifting, contact sports, etc  Refrain from any heavy lifting or straining until you are off narcotics for pain control.   DO NOT PUSH THROUGH PAIN.  Let pain be your guide: If it hurts to do something, don't do it.  Pain is your body warning you to avoid that activity for another week until the pain goes down. You may drive when you are no longer taking prescription pain medication, you can comfortably wear a seatbelt, and you can safely maneuver your car and apply brakes. You may have sexual intercourse when it is comfortable.  FOLLOW UP in our office Please call [facility] at [phone] to set up an appointment to see your surgeon in the office for a follow-up appointment approximately 2-3 weeks after your surgery. Make sure that you call for this appointment the day you arrive home to insure a convenient appointment time. 9. IF YOU HAVE DISABILITY OR FAMILY LEAVE FORMS, BRING THEM TO THE OFFICE FOR PROCESSING.  DO NOT GIVE THEM TO YOUR DOCTOR.   WHEN TO CALL US  [phone]: Poor pain control Reactions / problems with new medications (rash/itching, nausea, etc)  Fever over 101.5 F (38.5 C) Worsening swelling or bruising Continued bleeding from incision. Increased pain, redness, or drainage from the incision Difficulty breathing / swallowing   The clinic staff is available to answer your questions during regular business hours (8:30am-5pm).  Please don't hesitate to call and ask to speak to one of our nurses for clinical concerns.   If you have a medical emergency, go to the nearest emergency room or call 911.  A surgeon from [facility] is always on call at the  [facility], [place] [address], [place], [place]  [address] ? MAIN: [phone] ? TOLL FREE: [phone] ?  FAX [phone] [email or web address]

## 2024-05-11 NOTE — H&P (Signed)
 Sherry Morris [phone]   Referring Provider:  Jolee Madelin Patch, MD   Subjective   Chief Complaint: New Consultation (hidradenitis suppurativa, left axilla and central back)     History of Present Illness:    43 year old woman with history of diabetes, obesity, and hidradenitis suppurativa who presents for consultation regarding the latter.  She has struggled with this for at least 4 or 5 years.  She denies any previous surgical procedures for this but has seen a dermatologist and has had local steroid injections.  Reports that dairy consumption does exacerbate her symptoms.  Reports recurrent boils in bilateral axilla and on the mid back.  Several family members with hidradenitis and various procedures they have been through.  Currently most painful is the lesion in the right axilla.  Review of Systems: A complete review of systems was obtained from the patient.  I have reviewed this information and discussed as appropriate with the patient.  See HPI as well for other ROS.   Medical History: Past Medical History:  Diagnosis Date   Diabetes mellitus without complication (CMS/HHS-HCC)    Hyperlipidemia    Thyroid disease     There is no problem list on file for this patient.   Past Surgical History:  Procedure Laterality Date   CESAREAN SECTION     Thyroid Surgery       No Known Allergies  Current Outpatient Medications on File Prior to Visit  Medication Sig Dispense Refill   blood-glucose sensor (DEXCOM G7 SENSOR) Devi 1 each by Other route     insulin  GLARGINE (LANTUS  SOLOSTAR) pen injector (concentration 100 units/mL) Inject 20 Units subcutaneously     levocetirizine (XYZAL) 5 MG tablet Take 5 mg by mouth at bedtime     levonorgestreL (MIRENA 52 MG) IUD Insert 1 each into the uterus     OZEMPIC 2 mg/dose (8 mg/3 mL) pen injector ADMINISTER 2 MG UNDER THE SKIN EVERY 7 DAYS     rosuvastatin (CRESTOR) 5 MG tablet Take 5 mg by mouth once daily     No current  facility-administered medications on file prior to visit.    Family History  Problem Relation Age of Onset   High blood pressure (Hypertension) Mother    Hyperlipidemia (Elevated cholesterol) Mother    Hyperlipidemia (Elevated cholesterol) Father    Deep vein thrombosis (DVT or abnormal blood clot formation) Father      Social History   Tobacco Use  Smoking Status Former   Types: Cigarettes  Smokeless Tobacco Never     Social History   Socioeconomic History   Marital status: Single  Tobacco Use   Smoking status: Former    Types: Cigarettes   Smokeless tobacco: Never  Vaping Use   Vaping status: Never Used  Substance and Sexual Activity   Alcohol use: Yes    Alcohol/week: 2.0 - 6.0 standard drinks of alcohol    Types: 2 - 6 Standard drinks or equivalent per week   Drug use: Never   Social Drivers of Health   Financial Resource Strain: Medium Risk (04/01/2022)   [facility] prior to 11/27/2022.   Overall Financial Resource Strain (CARDIA)    Difficulty of Paying Living Expenses: Somewhat hard  Food Insecurity: Low Risk  (12/07/2023)   Received from [facility]   Hunger Vital Sign    Within the past 12 months, you worried that your food would run out before you got money to buy more: Never  true    Within the past 12 months, the food you bought just didn't last and you didn't have money to get more. : Never true  Transportation Needs: No Transportation Needs (12/07/2023)   Received from [facility]   Transportation    In the past 12 months, has lack of reliable transportation kept you from medical appointments, meetings, work or from getting things needed for daily living? : No  Physical Activity: Inactive (04/01/2022)   Received from [facility], [facility] visits prior to 11/27/2022.   Exercise Vital Sign    On average, how many days per week do you engage in moderate to strenuous exercise  (like a brisk walk)?: 0 days    On average, how many minutes do you engage in exercise at this level?: 0 min  Stress: Stress Concern Present (04/01/2022)   Received from [facility], [facility] visits prior to 11/27/2022.   [employer] of Occupational Health - Occupational Stress Questionnaire    Feeling of Stress : To some extent  Social Connections: Socially Isolated (04/01/2022)   Received from [facility], [facility] visits prior to 11/27/2022.   Social Connection and Isolation Panel    In a typical week, how many times do you talk on the phone with family, friends, or neighbors?: Twice a week    How often do you get together with friends or relatives?: Never    How often do you attend church or religious services?: Never    Do you belong to any clubs or organizations such as church groups, unions, fraternal or athletic groups, or school groups?: No    How often do you attend meetings of the clubs or organizations you belong to?: Never    Are you married, widowed, divorced, separated, never married, or living with a partner?: Never married  Housing Stability: Low Risk  (12/07/2023)   Received from [employer] Stability Vital Sign    What is your living situation today?: I have a steady place to live    Think about the place you live. Do you have problems with any of the following? Choose all that apply:: None/None on this list    Objective:    Vitals:   03/28/24 1508 03/28/24 1509  BP: 106/74   Pulse: 104   Temp: 36.7 C (98.1 F)   SpO2: 95%   Weight: (!) 131.5 kg (289 lb 12.8 oz)   Height: 162.6 cm (5' 4)   PainSc:  0-No pain    Body mass index is 49.74 kg/m.  Gen: A&Ox3, no distress  Chest: respiratory effort is normal. Neuro: no gross deficit Psych: appropriate mood and affect, normal insight/judgment intact  Skin: warm and dry.  In the right axilla is an area of chronic scar and drainage measuring  approximately 7 x 5 cm which is mildly tender with active purulent drainage.  In the left axilla, there is area of scar with several surrounding scars but no active sinus tract or drainage.  On the mid back where there is a crease there is an approximately 6 cm vertical area of induration and chronic scarring but no active abscess.    Assessment and Plan:  Diagnoses and all orders for this visit:  Hidradenitis suppurativa   Discussed the natural history/ pathophysiology of this disease. Discussed the gamut of management options including: -Smoking cessation -No shave, no chemical or perfumed soaps or deodorants -Keep area clean and  dry as possible, wear breathable clothing -Brewer's yeast free diet -Topical treatments including antibiotic ointment (clindamycin) with or without retinoin -Suppressive oral antibiotics -Dermatology referral to discuss immunomodulation therapy -and finally surgery which ranges from local unroofing to wide excision with rotational flap coverage would be the only way to ensure no further flares, we discussed that this is a large surgery requiring several weeks of wound healing, with risk of bleeding, infection, pain, scarring and nearly 100% issue with wound healing. Discussed that would only do one side at a time.  Patient wishes to pursue excision.  We will go ahead with local excision of the more painful lesion on the right axilla.  Questions welcomed and answered to her satisfaction.    Mitzie Freund MD FACS

## 2024-05-11 NOTE — Anesthesia Preprocedure Evaluation (Addendum)
 Anesthesia Evaluation  Patient identified by MRN, date of birth, ID band Patient awake    Reviewed: Allergy & Precautions, NPO status , Patient's Chart, lab work & pertinent test results  Airway Mallampati: III  TM Distance: >3 FB Neck ROM: Full    Dental  (+) Dental Advisory Given, Teeth Intact   Pulmonary neg pulmonary ROS   Pulmonary exam normal breath sounds clear to auscultation       Cardiovascular negative cardio ROS Normal cardiovascular exam Rhythm:Regular Rate:Normal     Neuro/Psych  PSYCHIATRIC DISORDERS  Depression     Neuromuscular disease    GI/Hepatic negative GI ROS, Neg liver ROS,,,  Endo/Other  diabetes  Class 3 obesity  Renal/GU negative Renal ROS     Musculoskeletal negative musculoskeletal ROS (+)    Abdominal  (+) + obese  Peds  Hematology negative hematology ROS (+)   Anesthesia Other Findings   Reproductive/Obstetrics                              Anesthesia Physical Anesthesia Plan  ASA: 3  Anesthesia Plan: MAC   Post-op Pain Management: Gabapentin  PO (pre-op)*, Tylenol  PO (pre-op)* and Toradol IV (intra-op)*   Induction: Intravenous  PONV Risk Score and Plan: 4 or greater and Treatment may vary due to age or medical condition, Propofol  infusion and TIVA  Airway Management Planned: LMA  Additional Equipment:   Intra-op Plan:   Post-operative Plan: Extubation in OR  Informed Consent: I have reviewed the patients History and Physical, chart, labs and discussed the procedure including the risks, benefits and alternatives for the proposed anesthesia with the patient or authorized representative who has indicated his/her understanding and acceptance.     Dental advisory given  Plan Discussed with: CRNA  Anesthesia Plan Comments:          Anesthesia Quick Evaluation

## 2024-05-11 NOTE — Transfer of Care (Signed)
 Immediate Anesthesia Transfer of Care Note  Patient: Sherry Morris  Procedure(s) Performed: EXCISION, HIDRADENITIS, AXILLA (Right)  Patient Location: PACU  Anesthesia Type:General  Level of Consciousness: awake and alert   Airway & Oxygen Therapy: Patient Spontanous Breathing and Patient connected to face mask oxygen  Post-op Assessment: Report given to RN and Post -op Vital signs reviewed and stable  Post vital signs: Reviewed and stable  Last Vitals:  Vitals Value Taken Time  BP    Temp    Pulse 89 05/11/24 13:49  Resp    SpO2 100 % 05/11/24 13:49  Vitals shown include unfiled device data.  Last Pain:  Vitals:   05/11/24 1206  TempSrc: Oral         Complications: No notable events documented.

## 2024-05-11 NOTE — Anesthesia Procedure Notes (Signed)
 Procedure Name: LMA Insertion Date/Time: 05/11/2024 12:48 PM  Performed by: Dartha Meckel, CRNAPre-anesthesia Checklist: Patient identified, Emergency Drugs available, Suction available and Patient being monitored Patient Re-evaluated:Patient Re-evaluated prior to induction Oxygen Delivery Method: Circle system utilized Preoxygenation: Pre-oxygenation with 100% oxygen Induction Type: IV induction Ventilation: Mask ventilation without difficulty LMA: LMA with gastric port inserted LMA Size: 4.0 Tube type: Oral Number of attempts: 1 Airway Equipment and Method: Stylet and Oral airway Placement Confirmation: ETT inserted through vocal cords under direct vision, positive ETCO2 and breath sounds checked- equal and bilateral Tube secured with: Tape Dental Injury: Teeth and Oropharynx as per pre-operative assessment

## 2024-05-14 ENCOUNTER — Encounter (HOSPITAL_COMMUNITY): Payer: Self-pay | Admitting: Surgery

## 2024-05-14 NOTE — Anesthesia Postprocedure Evaluation (Signed)
 Anesthesia Post Note  Patient: Sherry Morris  Procedure(s) Performed: EXCISION, HIDRADENITIS, AXILLA (Right)     Patient location during evaluation: PACU Anesthesia Type: MAC Level of consciousness: awake and alert Pain management: pain level controlled Vital Signs Assessment: post-procedure vital signs reviewed and stable Respiratory status: spontaneous breathing Cardiovascular status: stable Anesthetic complications: no   No notable events documented.  Last Vitals:  Vitals:   05/11/24 1515 05/11/24 1522  BP: (!) 147/85 (!) 153/90  Pulse: 73 81  Resp: 19 19  Temp: 36.5 C 36.5 C  SpO2: 92% 96%    Last Pain:  Vitals:   05/11/24 1522  TempSrc:   PainSc: 0-No pain                 Norleen Pope

## 2024-05-15 ENCOUNTER — Other Ambulatory Visit (HOSPITAL_COMMUNITY): Payer: Self-pay

## 2024-05-18 LAB — SURGICAL PATHOLOGY

## 2024-05-24 NOTE — Progress Notes (Signed)
 Pt presented today for removal of sutures from right axilla. Had PR EXCISION HIDRADENITIS AXILLARY SMPL/INTRM RPR  by Dr.Connor on 05/11/24. Incision was prepped with ChloraPrep. All sutures removed successfully, and pt tolerated well. Puja came in and accessed and advised of follow up and call office if needed before than. Advised pt to look for any signs of infection: swelling, increased redness, odorous drainage, fever and to call with any concerns or future questions. Pt verbalized understanding.

## 2024-07-12 ENCOUNTER — Ambulatory Visit: Admitting: Podiatry

## 2024-07-12 ENCOUNTER — Encounter: Payer: Self-pay | Admitting: Podiatry

## 2024-07-12 ENCOUNTER — Ambulatory Visit

## 2024-07-12 DIAGNOSIS — M25571 Pain in right ankle and joints of right foot: Secondary | ICD-10-CM

## 2024-07-12 DIAGNOSIS — M722 Plantar fascial fibromatosis: Secondary | ICD-10-CM

## 2024-07-12 DIAGNOSIS — M7751 Other enthesopathy of right foot: Secondary | ICD-10-CM

## 2024-07-12 DIAGNOSIS — M25572 Pain in left ankle and joints of left foot: Secondary | ICD-10-CM

## 2024-07-12 DIAGNOSIS — M7752 Other enthesopathy of left foot: Secondary | ICD-10-CM | POA: Diagnosis not present

## 2024-07-12 MED ORDER — MELOXICAM 15 MG PO TABS: 15.0000 mg | ORAL_TABLET | Freq: Every day | ORAL | 0 refills | Status: AC

## 2024-07-12 MED ORDER — TRIAMCINOLONE ACETONIDE 10 MG/ML IJ SUSP
10.0000 mg | Freq: Once | INTRAMUSCULAR | Status: AC
  Administered 2024-07-12: 10 mg

## 2024-07-12 NOTE — Patient Instructions (Signed)

## 2024-07-12 NOTE — Progress Notes (Signed)
 Subjective:  Patient ID: Sherry Morris, female    DOB: July 31, 1981,  MRN: [id number]  Chief Complaint  Patient presents with   Foot Pain    Bilateral foot pain  over whole foot  x 1+ years.  On feet a lot for work. tried different shoes.  Diabetic A1c 6.2  no anti coag    Discussed the use of AI scribe software for clinical note transcription with the patient, who gave verbal consent to proceed.  History of Present Illness Sherry Morris is a 43 year old female with diabetes who presents with bilateral foot pain.  She experiences bilateral foot pain for over a year, primarily on the top of the feet, exacerbated by pressure and activities like standing on tiptoes and morning ambulation. The pain radiates from the heel to the top of the foot. As a CNA working third shift, she engages in extensive walking. She finds [employer] and Crocs more comfortable, despite a general dislike for shoes. She uses a topical nerve pain relief product with some success for heel pain and carries a spiked roller for foot massage. No specific foot injury is reported. She is diabetic reports good control.      Objective:    Physical Exam EXTREMITIES: Pain with dorsiflexion of both feet. Soreness on heel with palpation of plantar medial and plantar central aspect, no pain with calcaneal squeeze.  No pain on palpation of Achilles tendon or at level of Achilles insertion.  Plantar medial heel there is increased adipose tissue noted bilaterally, soft tissue mass. MUSCULOSKELETAL: Soreness in sinus tarsi of both feet. Tightness of left Achilles with limited dorsiflexion. Limited dorsiflexion on right foot. Collapse with weight bearing. Inflammation in heels. DP and PT pulses palpable 2/4 bilaterally.  Capillary refill intact to the digits.  Negative Tinel's sign.  Protective sensation intact, light touch sensation intact. Pes planus foot type noted bilaterally.  Reducible with double heel rise.   No images  are attached to the encounter.    Results RADIOLOGY Foot X-ray left and right foot: Normal osseous mineralization.  Joint spaces preserved.  No acute fractures.  Pes planus foot type.  Heel spur formation present at plantar and posterior calcaneus bilaterally.  Increased talar head uncoverage noted bilaterally.  Increased talar declination angle noted bilaterally.   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 Bilateral plantar fasciitis      Plan:  Patient was evaluated and treated and all questions answered.  Assessment and Plan Assessment & Plan Bilateral plantar fasciitis Chronic heel pain consistent with plantar fasciitis, exacerbated by weight-bearing activities. Differential includes lipoma due to palpalble mass medial heel with adipose tissue consistency, MRI if symptoms persist. Treat heel pain first. - Administer steroid injection to heel, noting potential for elevated blood sugar due to diabetes. - Course of oral meloxicam sent to patient's pharmacy.  15 mg once a day, 30 tablets as needed.  Encouraged consistent use over the next 1 to 2 weeks. - Recommend shoes with good arch support: Duwaine Morris, Asics, New Balance, Ultra, [place]. - Provide over-the-counter shoe inserts-power steps were dispensed to the patient today. - Advise Achilles tendon stretching exercises: belt/towel flex, heel drops, wall push lunges.  Written instructions dispensed as well. - Suggest frozen water bottle or spikey roller for foot massage. - Include stretching packets in after-visit summary.  Bilateral sinus tarsi syndrome Bilateral sinus tarsi syndrome with soreness, aggravated by inadequate footwear and overpronation. Monitor response to plantar fasciitis  treatment. - Recommend supportive shoes with arch support and inserts. - Advise stretching exercises as outlined for plantar fasciitis. - Monitor response to plantar fasciitis treatment. - Anticipate benefit from oral meloxicam  as well. -I certify that this diagnosis represents a distinct and separate diagnosis that requires evaluation and treatment separate from other procedures or diagnosis   Bilateral foot and ankle enthesopathy Bilateral foot and ankle enthesopathy with soreness, related to flat foot and overpronation, causing tendon and nerve inflammation. - Recommend supportive footwear and inserts for overpronation. - Advise stretching and strengthening exercises as outlined for plantar fasciitis and sinus tarsi syndrome. - Monitor for improvement with conservative management.     No follow-ups on file.

## 2024-07-19 ENCOUNTER — Other Ambulatory Visit: Payer: Self-pay | Admitting: Surgery

## 2024-07-23 ENCOUNTER — Other Ambulatory Visit: Payer: Self-pay

## 2024-07-23 ENCOUNTER — Encounter (HOSPITAL_BASED_OUTPATIENT_CLINIC_OR_DEPARTMENT_OTHER): Payer: Self-pay | Admitting: Surgery

## 2024-07-23 NOTE — Progress Notes (Signed)
   07/23/24 1628  PAT Phone Screen  Is the patient taking a GLP-1 receptor agonist? Yes  Has the patient been informed on holding medication? (S)  Yes (Do not take on Sat (last dose 10/25))  Do You Have Diabetes? Yes  Do You Have Hypertension? No  Have You Ever Been to the ER for Asthma? No  Have You Taken Oral Steroids in the Past 3 Months? No  Do you Take Phenteramine or any Other Diet Drugs? No  Recent  Lab Work, EKG, CXR? Yes  Where was this test performed? EKG 05/02/24 10/8 A1c 6.3  Do you have a history of heart problems? No  Any Recent Hospitalizations? No  Height 5' 4 (1.626 m)  Weight 135.6 kg  Pat Appointment Scheduled Yes (BMET ht/ wt and anes consult)

## 2024-07-30 DIAGNOSIS — Z01818 Encounter for other preprocedural examination: Secondary | ICD-10-CM

## 2024-07-30 DIAGNOSIS — E119 Type 2 diabetes mellitus without complications: Secondary | ICD-10-CM

## 2024-08-01 ENCOUNTER — Other Ambulatory Visit: Payer: Self-pay

## 2024-08-01 ENCOUNTER — Encounter (HOSPITAL_COMMUNITY): Payer: Self-pay | Admitting: Surgery

## 2024-08-01 NOTE — H&P (Signed)
 PROVIDER: VICENTA DASIE POLI, MD  MRN: [id number] DOB: 1981/05/27 DATE OF ENCOUNTER: 07/19/2024 Subjective   Chief Complaint: Post Operative Visit (exc rt ax hidradenitis 8/15 Dr.Connor)   History of Present Illness: Sherry Morris is a 43 y.o. female who is seen today for hidradenitis. She is a patient of Dr. Adrien who underwent wide excision in her right axilla in August. She now has extensive flaring up in her left axilla and along her mid back at the midline. She is not currently on antibiotics. She reports both areas been draining. She reports her right axilla is doing well.    Review of Systems: A complete review of systems was obtained from the patient. I have reviewed this information and discussed as appropriate with the patient. See HPI as well for other ROS.  ROS   Medical History: Past Medical History:  Diagnosis Date  Diabetes mellitus without complication (CMS/HHS-HCC)  Hyperlipidemia  Thyroid disease   There is no problem list on file for this patient.  Past Surgical History:  Procedure Laterality Date  CESAREAN SECTION  Thyroid Surgery    No Known Allergies  Current Outpatient Medications on File Prior to Visit  Medication Sig Dispense Refill  blood-glucose sensor (DEXCOM G7 SENSOR) Devi 1 each by Other route  insulin  GLARGINE (LANTUS  SOLOSTAR) pen injector (concentration 100 units/mL) Inject 20 Units subcutaneously  levocetirizine (XYZAL) 5 MG tablet Take 5 mg by mouth at bedtime  levonorgestreL (MIRENA 52 MG) IUD Insert 1 each into the uterus  OZEMPIC 2 mg/dose (8 mg/3 mL) pen injector ADMINISTER 2 MG UNDER THE SKIN EVERY 7 DAYS  rosuvastatin (CRESTOR) 5 MG tablet Take 5 mg by mouth once daily   No current facility-administered medications on file prior to visit.   Family History  Problem Relation Age of Onset  High blood pressure (Hypertension) Mother  Hyperlipidemia (Elevated cholesterol) Mother  Hyperlipidemia (Elevated cholesterol) Father   Deep vein thrombosis (DVT or abnormal blood clot formation) Father    Social History   Tobacco Use  Smoking Status Former  Types: Cigarettes  Smokeless Tobacco Never    Social History   Socioeconomic History  Marital status: Single  Tobacco Use  Smoking status: Former  Types: Cigarettes  Smokeless tobacco: Never  Vaping Use  Vaping status: Never Used  Substance and Sexual Activity  Alcohol use: Yes  Alcohol/week: 1.0 - 2.0 standard drink of alcohol  Types: 1 - 2 Standard drinks or equivalent per week  Drug use: Never  Sexual activity: Defer   Social Drivers of Health   Financial Resource Strain: Medium Risk (04/01/2022)  [facility] prior to 11/27/2022.  Overall Financial Resource Strain (CARDIA)  Difficulty of Paying Living Expenses: Somewhat hard  Food Insecurity: Low Risk (12/07/2023)  Received from [facility]  Hunger Vital Sign  Within the past 12 months, you worried that your food would run out before you got money to buy more: Never true  Within the past 12 months, the food you bought just didn't last and you didn't have money to get more. : Never true  Transportation Needs: No Transportation Needs (12/07/2023)  Received from [employer]  In the past 12 months, has lack of reliable transportation kept you from medical appointments, meetings, work or from getting things needed for daily living? : No  Physical Activity: Inactive (04/01/2022)  Received [facility], [facility] visits prior to 11/27/2022.  Exercise Vital Sign  On average, how many days per week do you engage in moderate to strenuous exercise (like a brisk walk)?: 0 days  On average, how many minutes do you engage in exercise at this level?: 0 min  Stress: Stress Concern Present (04/01/2022)  Received from [facility], [facility] visits prior to 11/27/2022.  [employer] of  Occupational Health - Occupational Stress Questionnaire  Feeling of Stress : To some extent  Social Connections: Socially Isolated (04/01/2022)  [facility], [facility] visits prior to 11/27/2022.  Social Connection and Isolation Panel  In a typical week, how many times do you talk on the phone with family, friends, or neighbors?: Twice a week  How often do you get together with friends or relatives?: Never  How often do you attend church or religious services?: Never  Do you belong to any clubs or organizations such as church groups, unions, fraternal or athletic groups, or school groups?: No  How often do you attend meetings of the clubs or organizations you belong to?: Never  Are you married, widowed, divorced, separated, never married, or living with a partner?: Never married  Housing Stability: Low Risk (12/07/2023)  Received from [employer] Stability Vital Sign  What is your living situation today?: I have a steady place to live  Think about the place you live. Do you have problems with any of the following? Choose all that apply:: None/None on this list   Objective:   Vitals:  07/19/24 0943  Weight: (!) 136 kg (299 lb 12.8 oz)  Height: 162.6 cm (5' 4)  PainSc: 0-No pain   Body mass index is 51.46 kg/m.  Physical Exam   She appears well on exam. She is morbidly obese.  The right axillary incision remains well-healed  There are multiple draining sinus tracts in the left axilla consistent with hidradenitis  There are 2 openings at the midline on her mid back which either appear consistent with hidradenitis or infected sebaceous cysts. Length of the 2 areas on the back is approximately 5 cm  Labs, Imaging and Diagnostic Testing:  Assessment and Plan:   Diagnoses and all orders for this visit:  Hidradenitis  Other orders - doxycycline  (MONODOX ) 100 MG capsule; Take 1 capsule (100 mg total) by mouth 2 (two) times daily for  10 days - clindamycin (CLEOCIN T) 1 % topical gel; Apply topically 2 (two) times daily for 15 days    Again we discussed hidradenitis and she understands this is not a curable disease and surgery is limited to areas to control chronic drainage and infection. I believe she needs wide excision of the area on her back as well as in her left axilla. We will stage this and she would like the back to be done first as this causes the most discomfort. I will start her on antibiotics preoperatively and we will plan a wide excision of the hidradenitis on her mid back. I discussed the risk which includes but is not limited to bleeding, ongoing infection, the fact she would have sutures in place for several weeks, wound breakdown, cardiopulmonary issues with anesthesia, blood clots, excetra. We discussed the postoperative recovery as well. She understands and wished to proceed with wide excision of the hidradenitis on her mid back

## 2024-08-01 NOTE — Progress Notes (Signed)
 PCP - Jolee Madelin Patch, MD  Cardiologist -   PPM/ICD - denies Device Orders - n/a Rep Notified - n/a  Chest x-ray - denies EKG - 05-02-24 Stress Test - denies ECHO - denies Cardiac Cath - denies  CPAP - denies  GLP-1 -OZEMPIC, Last dose on 07-21-24 ( takes on Saturday and the takes Lantus  at that time as well) Per patient Lantus  she takes weekly with Ozempic  Fasting Blood Sugar - does not check blood sugar    Blood Thinner Instructions: denies Aspirin  Instructions: denies  ERAS Protcol -  clear liquids until 12:30  COVID TEST- n/a  Anesthesia review: no  Patient verbally denies any shortness of breath, fever, cough and chest pain during phone call   -------------  SDW INSTRUCTIONS given:  Your procedure is scheduled on August 02, 2024.  Report to [facility] at 1:00 P.M., and check in at the Admitting office.  Call this number if you have problems the morning of surgery:  [phone]   Remember:  Do not eat after midnight the night before your surgery  You may drink clear liquids until 12:30 the morning of your surgery.   Clear liquids allowed are: Water, Non-Citrus Juices (without pulp), Carbonated Beverages, Clear Tea, Black Coffee Only, and Gatorade    Take these medicines the morning of surgery with A SIP OF WATER      As of today, STOP taking any Aspirin  (unless otherwise instructed by your surgeon) Aleve , Naproxen , Ibuprofen , Motrin , Advil , Goody's, BC's, all herbal medications, fish oil, and all vitamins. THIS  INCLUDES YOUR meloxicam (MOBIC)       WHAT DO I DO ABOUT MY DIABETES MEDICATION?   Do not take oral diabetes medicines (pills) the morning of surgery.  THE NIGHT BEFORE SURGERY, take ___________ units of ___________insulin.       THE MORNING OF SURGERY, take _____________ units of __________insulin.  The day of surgery, do not take other diabetes injectables, including Byetta (exenatide), Bydureon (exenatide ER),  Victoza (liraglutide), or Trulicity (dulaglutide).  If your CBG is greater than 220 mg/dL, you may take  of your sliding scale (correction) dose of insulin .   HOW TO MANAGE YOUR DIABETES BEFORE AND AFTER SURGERY  Why is it important to control my blood sugar before and after surgery? Improving blood sugar levels before and after surgery helps healing and can limit problems. A way of improving blood sugar control is eating a healthy diet by:  Eating less sugar and carbohydrates  Increasing activity/exercise  Talking with your doctor about reaching your blood sugar goals High blood sugars (greater than 180 mg/dL) can raise your risk of infections and slow your recovery, so you will need to focus on controlling your diabetes during the weeks before surgery. Make sure that the doctor who takes care of your diabetes knows about your planned surgery including the date and location.  How do I manage my blood sugar before surgery? Check your blood sugar at least 4 times a day, starting 2 days before surgery, to make sure that the level is not too high or low.  Check your blood sugar the morning of your surgery when you wake up and every 2 hours until you get to the Short Stay unit.  If your blood sugar is less than 70 mg/dL, you will need to treat for low blood sugar: Do not take insulin . Treat a low blood sugar (less than 70 mg/dL) with  cup of clear juice (cranberry or apple),  4 glucose tablets, OR glucose gel. Recheck blood sugar in 15 minutes after treatment (to make sure it is greater than 70 mg/dL). If your blood sugar is not greater than 70 mg/dL on recheck, call [phone] for further instructions. Report your blood sugar to the short stay nurse when you get to Short Stay.  If you are admitted to the hospital after surgery: Your blood sugar will be checked by the staff and you will probably be given insulin  after surgery (instead of oral diabetes medicines) to make sure you have good  blood sugar levels. The goal for blood sugar control after surgery is 80-180 mg/dL.                 Do not wear jewelry, make up, or nail polish            Do not wear lotions, powders, perfumes/colognes, or deodorant.            Do not shave 48 hours prior to surgery.  Men may shave face and neck.            Do not bring valuables to the hospital.            [facility] is not responsible for any belongings or valuables.  Do NOT Smoke (Tobacco/Vaping) 24 hours prior to your procedure If you use a CPAP at night, you may bring all equipment for your overnight stay.   Contacts, glasses, dentures or bridgework may not be worn into surgery.      For patients admitted to the hospital, discharge time will be determined by your treatment team.   Patients discharged the day of surgery will not be allowed to drive home, and someone needs to stay with them for 24 hours.    Special instructions:   [place]- Preparing For Surgery  Before surgery, you can play an important role. Because skin is not sterile, your skin needs to be as free of germs as possible. You can reduce the number of germs on your skin by washing with CHG (chlorahexidine gluconate) Soap before surgery.  CHG is an antiseptic cleaner which kills germs and bonds with the skin to continue killing germs even after washing.    Oral Hygiene is also important to reduce your risk of infection.  Remember - BRUSH YOUR TEETH THE MORNING OF SURGERY WITH YOUR REGULAR TOOTHPASTE  Please do not use if you have an allergy to CHG or antibacterial soaps. If your skin becomes reddened/irritated stop using the CHG.  Do not shave (including legs and underarms) for at least 48 hours prior to first CHG shower. It is OK to shave your face.  Please follow these instructions carefully.   Shower the NIGHT BEFORE SURGERY and the MORNING OF SURGERY with DIAL Soap.   Pat yourself dry with a CLEAN TOWEL.  Wear CLEAN PAJAMAS to bed the night before  surgery  Place CLEAN SHEETS on your bed the night of your first shower and DO NOT SLEEP WITH PETS.   Day of Surgery: Please shower morning of surgery  Wear Clean/Comfortable clothing the morning of surgery Do not apply any deodorants/lotions.   Remember to brush your teeth WITH YOUR REGULAR TOOTHPASTE.   Questions were answered. Patient verbalized understanding of instructions.

## 2024-08-02 ENCOUNTER — Ambulatory Visit (HOSPITAL_COMMUNITY): Admitting: Anesthesiology

## 2024-08-02 ENCOUNTER — Encounter (HOSPITAL_COMMUNITY)
Admission: RE | Disposition: A | Discharge status: Home / Self Care | Payer: Self-pay | Source: Home / Self Care | Attending: Surgery

## 2024-08-02 ENCOUNTER — Ambulatory Visit: Admitting: Podiatry

## 2024-08-02 ENCOUNTER — Encounter (HOSPITAL_COMMUNITY): Payer: Self-pay | Admitting: Surgery

## 2024-08-02 ENCOUNTER — Other Ambulatory Visit: Payer: Self-pay

## 2024-08-02 ENCOUNTER — Ambulatory Visit (HOSPITAL_COMMUNITY)
Admission: RE | Admit: 2024-08-02 | Discharge: 2024-08-02 | Disposition: A | Discharge status: Home / Self Care | Attending: Surgery | Admitting: Surgery

## 2024-08-02 DIAGNOSIS — E1165 Type 2 diabetes mellitus with hyperglycemia: Secondary | ICD-10-CM

## 2024-08-02 DIAGNOSIS — Z6841 Body Mass Index (BMI) 40.0 and over, adult: Secondary | ICD-10-CM | POA: Diagnosis not present

## 2024-08-02 DIAGNOSIS — L732 Hidradenitis suppurativa: Secondary | ICD-10-CM | POA: Diagnosis not present

## 2024-08-02 DIAGNOSIS — E66813 Obesity, class 3: Secondary | ICD-10-CM | POA: Diagnosis not present

## 2024-08-02 DIAGNOSIS — Z794 Long term (current) use of insulin: Secondary | ICD-10-CM | POA: Insufficient documentation

## 2024-08-02 DIAGNOSIS — G709 Myoneural disorder, unspecified: Secondary | ICD-10-CM | POA: Diagnosis not present

## 2024-08-02 DIAGNOSIS — E119 Type 2 diabetes mellitus without complications: Secondary | ICD-10-CM | POA: Diagnosis not present

## 2024-08-02 DIAGNOSIS — Z01818 Encounter for other preprocedural examination: Secondary | ICD-10-CM

## 2024-08-02 HISTORY — PX: EXCISION OF BACK LESION: SHX6597

## 2024-08-02 HISTORY — DX: Hyperlipidemia, unspecified: E78.5

## 2024-08-02 HISTORY — DX: Hypothyroidism, unspecified: E03.9

## 2024-08-02 LAB — BASIC METABOLIC PANEL WITH GFR
Anion gap: 11 (ref 5–15)
BUN: 7 mg/dL (ref 6–20)
CO2: 24 mmol/L (ref 22–32)
Calcium: 8.8 mg/dL — ABNORMAL LOW (ref 8.9–10.3)
Chloride: 102 mmol/L (ref 98–111)
Creatinine, Ser: 0.55 mg/dL (ref 0.44–1.00)
GFR, Estimated: >60 mL/min (ref >60)
Glucose, Bld: 103 mg/dL — ABNORMAL HIGH (ref 70–99)
Potassium: 3.7 mmol/L (ref 3.5–5.1)
Sodium: 137 mmol/L (ref 135–145)

## 2024-08-02 LAB — POCT PREGNANCY, URINE: Preg Test, Ur: NEGATIVE

## 2024-08-02 LAB — CBC
HCT: 39.3 % (ref 36.0–46.0)
Hemoglobin: 12.1 g/dL (ref 12.0–15.0)
MCH: 24.5 pg — ABNORMAL LOW (ref 26.0–34.0)
MCHC: 30.8 g/dL (ref 30.0–36.0)
MCV: 79.7 fL — ABNORMAL LOW (ref 80.0–100.0)
Platelets: 293 K/uL (ref 150–400)
RBC: 4.93 MIL/uL (ref 3.87–5.11)
RDW: 14.4 % (ref 11.5–15.5)
WBC: 10 K/uL (ref 4.0–10.5)
nRBC: 0 % (ref 0.0–0.2)

## 2024-08-02 LAB — GLUCOSE, CAPILLARY: Glucose-Capillary: 101 mg/dL — ABNORMAL HIGH (ref 70–99)

## 2024-08-02 SURGERY — EXCISION, LESION, BACK
Anesthesia: General

## 2024-08-02 MED ORDER — ACETAMINOPHEN 500 MG PO TABS
ORAL_TABLET | ORAL | Status: AC
  Administered 2024-08-02: 1000 mg via ORAL
  Filled 2024-08-02: qty 2

## 2024-08-02 MED ORDER — ROCURONIUM BROMIDE 10 MG/ML (PF) SYRINGE
PREFILLED_SYRINGE | INTRAVENOUS | Status: AC
Start: 2024-08-02 — End: 2024-08-02
  Filled 2024-08-02: qty 10

## 2024-08-02 MED ORDER — BUPIVACAINE-EPINEPHRINE 0.25% -1:200000 IJ SOLN
INTRAMUSCULAR | Status: DC | PRN
Start: 2024-08-02 — End: 2024-08-02
  Administered 2024-08-02: 10 mL
  Administered 2024-08-02: 5 mL

## 2024-08-02 MED ORDER — PROPOFOL 10 MG/ML IV BOLUS
INTRAVENOUS | Status: AC
  Filled 2024-08-02: qty 20

## 2024-08-02 MED ORDER — LACTATED RINGERS IV SOLN: INTRAVENOUS | Status: DC

## 2024-08-02 MED ORDER — FENTANYL CITRATE (PF) 100 MCG/2ML IJ SOLN
INTRAMUSCULAR | Status: AC
  Filled 2024-08-02: qty 2

## 2024-08-02 MED ORDER — LIDOCAINE 2% (20 MG/ML) 5 ML SYRINGE
INTRAMUSCULAR | Status: DC | PRN
  Administered 2024-08-02: 100 mg via INTRAVENOUS

## 2024-08-02 MED ORDER — ROCURONIUM BROMIDE 10 MG/ML (PF) SYRINGE
PREFILLED_SYRINGE | INTRAVENOUS | Status: DC | PRN
  Administered 2024-08-02: 50 mg via INTRAVENOUS

## 2024-08-02 MED ORDER — CHLORHEXIDINE GLUCONATE CLOTH 2 % EX PADS: 6.0000 | MEDICATED_PAD | Freq: Once | CUTANEOUS | Status: DC

## 2024-08-02 MED ORDER — OXYCODONE HCL 5 MG PO TABS: 5.0000 mg | ORAL_TABLET | Freq: Four times a day (QID) | ORAL | 0 refills | Status: AC | PRN

## 2024-08-02 MED ORDER — SUGAMMADEX SODIUM 200 MG/2ML IV SOLN
INTRAVENOUS | Status: DC | PRN
  Administered 2024-08-02: 200 mg via INTRAVENOUS

## 2024-08-02 MED ORDER — DROPERIDOL 2.5 MG/ML IJ SOLN: 0.6250 mg | Freq: Once | INTRAMUSCULAR | Status: DC | PRN

## 2024-08-02 MED ORDER — BUPIVACAINE-EPINEPHRINE (PF) 0.25% -1:200000 IJ SOLN
INTRAMUSCULAR | Status: AC
  Filled 2024-08-02: qty 30

## 2024-08-02 MED ORDER — FLUCONAZOLE 150 MG PO TABS: 150.0000 mg | ORAL_TABLET | Freq: Every day | ORAL | 3 refills | Status: AC

## 2024-08-02 MED ORDER — PROPOFOL 10 MG/ML IV BOLUS
INTRAVENOUS | Status: DC | PRN
  Administered 2024-08-02: 200 mg via INTRAVENOUS

## 2024-08-02 MED ORDER — ORAL CARE MOUTH RINSE
15.0000 mL | Freq: Once | OROMUCOSAL | Status: AC
Start: 2024-08-02 — End: 2024-08-02

## 2024-08-02 MED ORDER — FENTANYL CITRATE (PF) 250 MCG/5ML IJ SOLN
INTRAMUSCULAR | Status: DC | PRN
Start: 2024-08-02 — End: 2024-08-02
  Administered 2024-08-02: 100 ug via INTRAVENOUS

## 2024-08-02 MED ORDER — CHLORHEXIDINE GLUCONATE 0.12 % MT SOLN: 15.0000 mL | Freq: Once | OROMUCOSAL | Status: AC

## 2024-08-02 MED ORDER — FENTANYL CITRATE (PF) 100 MCG/2ML IJ SOLN
25.0000 ug | INTRAMUSCULAR | Status: DC | PRN
  Administered 2024-08-02: 50 ug via INTRAVENOUS

## 2024-08-02 MED ORDER — 0.9 % SODIUM CHLORIDE (POUR BTL) OPTIME
TOPICAL | Status: DC | PRN
  Administered 2024-08-02: 1000 mL

## 2024-08-02 MED ORDER — ONDANSETRON HCL 4 MG/2ML IJ SOLN
INTRAMUSCULAR | Status: AC
Start: 2024-08-02 — End: 2024-08-02
  Filled 2024-08-02: qty 2

## 2024-08-02 MED ORDER — ONDANSETRON HCL 4 MG/2ML IJ SOLN
INTRAMUSCULAR | Status: AC
  Filled 2024-08-02: qty 2

## 2024-08-02 MED ORDER — DOXYCYCLINE MONOHYDRATE 100 MG PO CAPS: 100.0000 mg | ORAL_CAPSULE | Freq: Two times a day (BID) | ORAL | 0 refills | Status: AC

## 2024-08-02 MED ORDER — LIDOCAINE 2% (20 MG/ML) 5 ML SYRINGE
INTRAMUSCULAR | Status: AC
Start: 2024-08-02 — End: 2024-08-02
  Filled 2024-08-02: qty 5

## 2024-08-02 MED ORDER — ONDANSETRON HCL 4 MG/2ML IJ SOLN
INTRAMUSCULAR | Status: DC | PRN
  Administered 2024-08-02: 4 mg via INTRAVENOUS

## 2024-08-02 MED ORDER — ENSURE PRE-SURGERY PO LIQD: 296.0000 mL | Freq: Once | ORAL | Status: DC

## 2024-08-02 MED ORDER — MIDAZOLAM HCL (PF) 2 MG/2ML IJ SOLN
INTRAMUSCULAR | Status: DC | PRN
  Administered 2024-08-02: 2 mg via INTRAVENOUS

## 2024-08-02 MED ORDER — CHLORHEXIDINE GLUCONATE 0.12 % MT SOLN
OROMUCOSAL | Status: AC
Start: 2024-08-02 — End: 2024-08-02
  Administered 2024-08-02: 15 mL via OROMUCOSAL
  Filled 2024-08-02: qty 15

## 2024-08-02 MED ORDER — MIDAZOLAM HCL 2 MG/2ML IJ SOLN
INTRAMUSCULAR | Status: AC
Start: 2024-08-02 — End: 2024-08-02
  Filled 2024-08-02: qty 2

## 2024-08-02 MED ORDER — CEFAZOLIN SODIUM-DEXTROSE 3-4 GM/150ML-% IV SOLN
3.0000 g | INTRAVENOUS | Status: AC
  Administered 2024-08-02: 3 g via INTRAVENOUS

## 2024-08-02 MED ORDER — ACETAMINOPHEN 500 MG PO TABS: 1000.0000 mg | ORAL_TABLET | ORAL | Status: AC

## 2024-08-02 MED ORDER — DEXAMETHASONE SOD PHOSPHATE PF 10 MG/ML IJ SOLN
INTRAMUSCULAR | Status: DC | PRN
  Administered 2024-08-02: 5 mg via INTRAVENOUS

## 2024-08-02 SURGICAL SUPPLY — 27 items
BAG COUNTER SPONGE SURGICOUNT (BAG) IMPLANT
CANISTER SUCTION 3000ML PPV (SUCTIONS) IMPLANT
COVER SURGICAL LIGHT HANDLE (MISCELLANEOUS) ×2 IMPLANT
DERMABOND ADVANCED .7 DNX12 (GAUZE/BANDAGES/DRESSINGS) ×2 IMPLANT
DRAPE LAPAROSCOPIC ABDOMINAL (DRAPES) IMPLANT
DRAPE LAPAROTOMY 100X72 PEDS (DRAPES) IMPLANT
DRSG TEGADERM 4X4.5 CHG (GAUZE/BANDAGES/DRESSINGS) IMPLANT
ELECTRODE REM PT RTRN 9FT ADLT (ELECTROSURGICAL) ×2 IMPLANT
GAUZE 4X4 16PLY ~~LOC~~+RFID DBL (SPONGE) ×2 IMPLANT
GAUZE SPONGE 4X4 12PLY STRL (GAUZE/BANDAGES/DRESSINGS) ×2 IMPLANT
GLOVE SURG SIGNA 7.5 PF LTX (GLOVE) ×2 IMPLANT
GOWN STRL REUS W/ TWL LRG LVL3 (GOWN DISPOSABLE) ×2 IMPLANT
GOWN STRL REUS W/ TWL XL LVL3 (GOWN DISPOSABLE) ×2 IMPLANT
KIT BASIN OR (CUSTOM PROCEDURE TRAY) ×2 IMPLANT
KIT TURNOVER KIT B (KITS) ×2 IMPLANT
NDL HYPO 25GX1X1/2 BEV (NEEDLE) ×2 IMPLANT
NEEDLE HYPO 25GX1X1/2 BEV (NEEDLE) ×1 IMPLANT
PACK GENERAL/GYN (CUSTOM PROCEDURE TRAY) ×2 IMPLANT
PAD ARMBOARD POSITIONER FOAM (MISCELLANEOUS) ×4 IMPLANT
PENCIL SMOKE EVACUATOR (MISCELLANEOUS) ×2 IMPLANT
SOLN 0.9% NACL POUR BTL 1000ML (IV SOLUTION) ×2 IMPLANT
SPIKE FLUID TRANSFER (MISCELLANEOUS) IMPLANT
SUT MNCRL AB 4-0 PS2 18 (SUTURE) ×2 IMPLANT
SUT VIC AB 3-0 SH 27XBRD (SUTURE) ×2 IMPLANT
SYR CONTROL 10ML LL (SYRINGE) ×2 IMPLANT
TOWEL GREEN STERILE (TOWEL DISPOSABLE) ×2 IMPLANT
TOWEL GREEN STERILE FF (TOWEL DISPOSABLE) ×2 IMPLANT

## 2024-08-02 NOTE — Transfer of Care (Signed)
 Immediate Anesthesia Transfer of Care Note  Patient: Sherry Morris  Procedure(s) Performed: EXCISION, LESION, BACK  Patient Location: PACU  Anesthesia Type:General  Level of Consciousness: awake, alert , and oriented  Airway & Oxygen Therapy: Patient Spontanous Breathing  Post-op Assessment: Report given to RN and Post -op Vital signs reviewed and stable  Post vital signs: Reviewed and stable  Last Vitals:  Vitals Value Taken Time  BP 135/95 08/02/24 16:54  Temp    Pulse 87 08/02/24 16:58  Resp 20 08/02/24 16:58  SpO2 97 % 08/02/24 16:58  Vitals shown include unfiled device data.  Last Pain:  Vitals:   08/02/24 1321  TempSrc:   PainSc: 0-No pain         Complications: No notable events documented.

## 2024-08-02 NOTE — Interval H&P Note (Signed)
 History and Physical Interval Note:no change in H and P  08/02/2024 3:50 PM  Sherry Morris  has presented today for surgery, with the diagnosis of HIDRADENITIS.  The various methods of treatment have been discussed with the patient and family. After consideration of risks, benefits and other options for treatment, the patient has consented to  Procedure(s) with comments: EXCISION, LESION, BACK (N/A) - WIDE EXCISION HIDRADENITIS MID BACK as a surgical intervention.  The patient's history has been reviewed, patient examined, no change in status, stable for surgery.  I have reviewed the patient's chart and labs.  Questions were answered to the patient's satisfaction.     Vicenta Poli

## 2024-08-02 NOTE — Op Note (Signed)
   Sherry Morris 08/02/2024   Pre-op Diagnosis: HIDRADENITIS MID BACK     Post-op Diagnosis: SAME  Procedure(s): WIDE EXCISION HIDRADENITIS MID BACK (6 CM)  Surgeon(s): Vernetta Berg, MD  Anesthesia: General  Staff:  Circulator: Melvenia Fairy HERO, RN Relief Circulator: everitt Meribeth Oneil KANDICE, RN Scrub Person: Gail Mungo E  Estimated Blood Loss: Minimal               Specimens: SENT TO PATH  Findings: The patient had 2 draining sinus tracts in her midline of the mid back.  There was extensive granulation tissue consistent with hidradenitis underneath these 2 areas.  Procedure: The patient was brought to the operating room and identified the correct patient.  She was placed upon the operating room table and general anesthesia was induced.  She was next placed in the right lateral decubitus position.  Her back was then prepped and draped in usual sterile fashion.  I anesthetized the skin around the 2 draining sinus tracts with Marcaine  and then performed an elliptical incision measuring approximately 6 cm around the 2 areas incorporating the draining tracts.  I then dissected down to the deep subcutaneous tissue with electrocautery circumferentially.  I then removed the subcutaneous tissue and overlying skin.  I then debrided the granulation tissue with the cautery excising is much as possible.  Again, the tissue appeared consistent with hidradenitis.  I then irrigated the incision with saline.  I injected further Marcaine  into the incision. I then closed the incision with horizontal mattress and interrupted 2-0 nylon sutures.  Gauze and tape were then applied.  The patient tolerated the procedure well.  All the counts were correct at the end of the procedure.  The patient was then placed back into the supine position on the stretcher.  She was then extubated in the operating room and taken in a stable condition to the recovery room.          Berg Vernetta   Date: 08/02/2024  Time:  4:49 PM

## 2024-08-02 NOTE — Anesthesia Procedure Notes (Signed)
 Procedure Name: Intubation Date/Time: 08/02/2024 4:08 PM  Performed by: Takyah Ciaramitaro A, CRNAPre-anesthesia Checklist: Patient identified, Emergency Drugs available, Suction available and Patient being monitored Patient Re-evaluated:Patient Re-evaluated prior to induction Oxygen Delivery Method: Circle System Utilized Preoxygenation: Pre-oxygenation with 100% oxygen Induction Type: IV induction Ventilation: Mask ventilation without difficulty Laryngoscope Size: Mac and 3 Grade View: Grade II Tube type: Oral Tube size: 7.5 mm Number of attempts: 1 Airway Equipment and Method: Stylet and Oral airway Placement Confirmation: ETT inserted through vocal cords under direct vision, positive ETCO2 and breath sounds checked- equal and bilateral Secured at: 22 cm Tube secured with: Tape Dental Injury: Teeth and Oropharynx as per pre-operative assessment

## 2024-08-02 NOTE — Discharge Instructions (Addendum)
 You may shower starting tomorrow  Expect drainage from the incision.  Cover with dry guaze and change daily and as needed  Ice pack, tylenol , and ibuprofen  also for pain

## 2024-08-02 NOTE — Anesthesia Preprocedure Evaluation (Addendum)
 Anesthesia Evaluation  Patient identified by MRN, date of birth, ID band Patient awake    Reviewed: Allergy & Precautions, NPO status , Patient's Chart, lab work & pertinent test results  Airway Mallampati: III  TM Distance: >3 FB Neck ROM: Full    Dental  (+) Dental Advisory Given, Teeth Intact   Pulmonary neg pulmonary ROS   Pulmonary exam normal breath sounds clear to auscultation       Cardiovascular negative cardio ROS Normal cardiovascular exam Rhythm:Regular Rate:Normal     Neuro/Psych Seizures -,  PSYCHIATRIC DISORDERS  Depression     Neuromuscular disease    GI/Hepatic negative GI ROS, Neg liver ROS,neg GERD  ,,  Endo/Other  diabetes  Class 3 obesity  Renal/GU negative Renal ROS     Musculoskeletal negative musculoskeletal ROS (+)    Abdominal  (+) + obese  Peds  Hematology negative hematology ROS (+)   Anesthesia Other Findings   Reproductive/Obstetrics                              Anesthesia Physical Anesthesia Plan  ASA: 3  Anesthesia Plan: General   Post-op Pain Management: Tylenol  PO (pre-op)* and Toradol IV (intra-op)*   Induction: Intravenous  PONV Risk Score and Plan: 4 or greater and Treatment may vary due to age or medical condition, Ondansetron  and Dexamethasone   Airway Management Planned: Oral ETT  Additional Equipment:   Intra-op Plan:   Post-operative Plan: Extubation in OR  Informed Consent: I have reviewed the patients History and Physical, chart, labs and discussed the procedure including the risks, benefits and alternatives for the proposed anesthesia with the patient or authorized representative who has indicated his/her understanding and acceptance.     Dental advisory given  Plan Discussed with: CRNA  Anesthesia Plan Comments:          Anesthesia Quick Evaluation

## 2024-08-03 ENCOUNTER — Encounter (HOSPITAL_COMMUNITY): Payer: Self-pay | Admitting: Surgery

## 2024-08-03 LAB — GLUCOSE, CAPILLARY: Glucose-Capillary: 107 mg/dL — ABNORMAL HIGH (ref 70–99)

## 2024-08-03 NOTE — Anesthesia Postprocedure Evaluation (Signed)
 Anesthesia Post Note  Patient: Sherry Morris  Procedure(s) Performed: EXCISION, LESION, BACK     Patient location during evaluation: PACU Anesthesia Type: General Level of consciousness: sedated and patient cooperative Pain management: pain level controlled Vital Signs Assessment: post-procedure vital signs reviewed and stable Respiratory status: spontaneous breathing Cardiovascular status: stable Anesthetic complications: no   No notable events documented.  Last Vitals:  Vitals:   08/02/24 1730 08/02/24 1745  BP: 114/76 111/70  Pulse: 89 84  Resp: 10 16  Temp:  36.8 C  SpO2: 94% 94%    Last Pain:  Vitals:   08/02/24 1730  TempSrc:   PainSc: Asleep                 Norleen Pope

## 2024-08-07 LAB — SURGICAL PATHOLOGY
